# Patient Record
Sex: Female | Born: 1985 | Race: Black or African American | Hispanic: No | Marital: Single | Smoking: Never smoker
Health system: Southern US, Community
[De-identification: ages and names within clinical notes are randomized; demographics above are authoritative.]

## PROBLEM LIST (undated history)

## (undated) DIAGNOSIS — E039 Hypothyroidism, unspecified: Secondary | ICD-10-CM

## (undated) DIAGNOSIS — I749 Embolism and thrombosis of unspecified artery: Secondary | ICD-10-CM

## (undated) DIAGNOSIS — M329 Systemic lupus erythematosus, unspecified: Secondary | ICD-10-CM

## (undated) DIAGNOSIS — R809 Proteinuria, unspecified: Secondary | ICD-10-CM

## (undated) HISTORY — PX: KNEE SURGERY: SHX244

## (undated) HISTORY — PX: THYROID LOBECTOMY: SHX420

---
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
## 2005-03-03 ENCOUNTER — Other Ambulatory Visit: Admission: RE | Admit: 2005-03-03 | Discharge: 2005-03-03 | Payer: Self-pay | Admitting: Family Medicine

## 2006-09-28 ENCOUNTER — Emergency Department (HOSPITAL_COMMUNITY): Admission: EM | Admit: 2006-09-28 | Discharge: 2006-09-28 | Payer: Self-pay | Admitting: Emergency Medicine

## 2007-07-08 ENCOUNTER — Other Ambulatory Visit: Admission: RE | Admit: 2007-07-08 | Discharge: 2007-07-08 | Payer: Self-pay | Admitting: Family Medicine

## 2008-07-11 ENCOUNTER — Other Ambulatory Visit: Admission: RE | Admit: 2008-07-11 | Discharge: 2008-07-11 | Payer: Self-pay | Admitting: Family Medicine

## 2008-09-08 ENCOUNTER — Emergency Department (HOSPITAL_COMMUNITY): Admission: EM | Admit: 2008-09-08 | Discharge: 2008-09-08 | Payer: Self-pay | Admitting: Emergency Medicine

## 2008-09-30 ENCOUNTER — Emergency Department (HOSPITAL_COMMUNITY): Admission: EM | Admit: 2008-09-30 | Discharge: 2008-09-30 | Payer: Self-pay | Admitting: Emergency Medicine

## 2008-10-03 ENCOUNTER — Ambulatory Visit (HOSPITAL_COMMUNITY): Admission: RE | Admit: 2008-10-03 | Discharge: 2008-10-03 | Payer: Self-pay | Admitting: Family Medicine

## 2008-10-03 DIAGNOSIS — R809 Proteinuria, unspecified: Secondary | ICD-10-CM

## 2008-10-03 HISTORY — DX: Proteinuria, unspecified: R80.9

## 2008-10-07 ENCOUNTER — Ambulatory Visit: Payer: Self-pay | Admitting: Vascular Surgery

## 2008-10-07 ENCOUNTER — Encounter (INDEPENDENT_AMBULATORY_CARE_PROVIDER_SITE_OTHER): Payer: Self-pay | Admitting: Emergency Medicine

## 2008-10-07 ENCOUNTER — Inpatient Hospital Stay (HOSPITAL_COMMUNITY): Admission: EM | Admit: 2008-10-07 | Discharge: 2008-10-13 | Payer: Self-pay | Admitting: Emergency Medicine

## 2008-10-08 ENCOUNTER — Encounter (INDEPENDENT_AMBULATORY_CARE_PROVIDER_SITE_OTHER): Payer: Self-pay | Admitting: Internal Medicine

## 2008-11-13 ENCOUNTER — Encounter: Admission: RE | Admit: 2008-11-13 | Discharge: 2008-11-13 | Payer: Self-pay | Admitting: Internal Medicine

## 2008-12-09 ENCOUNTER — Emergency Department (HOSPITAL_COMMUNITY): Admission: EM | Admit: 2008-12-09 | Discharge: 2008-12-09 | Payer: Self-pay | Admitting: Emergency Medicine

## 2008-12-26 ENCOUNTER — Ambulatory Visit (HOSPITAL_COMMUNITY): Admission: RE | Admit: 2008-12-26 | Discharge: 2008-12-26 | Payer: Self-pay | Admitting: Obstetrics and Gynecology

## 2009-04-28 ENCOUNTER — Emergency Department (HOSPITAL_COMMUNITY): Admission: EM | Admit: 2009-04-28 | Discharge: 2009-04-28 | Payer: Self-pay | Admitting: Emergency Medicine

## 2009-05-03 ENCOUNTER — Emergency Department (HOSPITAL_COMMUNITY): Admission: EM | Admit: 2009-05-03 | Discharge: 2009-05-03 | Payer: Self-pay | Admitting: Emergency Medicine

## 2009-06-28 ENCOUNTER — Encounter (INDEPENDENT_AMBULATORY_CARE_PROVIDER_SITE_OTHER): Payer: Self-pay | Admitting: Nephrology

## 2009-06-28 ENCOUNTER — Ambulatory Visit (HOSPITAL_COMMUNITY): Admission: RE | Admit: 2009-06-28 | Discharge: 2009-06-29 | Payer: Self-pay | Admitting: Nephrology

## 2009-07-03 DIAGNOSIS — M329 Systemic lupus erythematosus, unspecified: Secondary | ICD-10-CM

## 2009-07-03 HISTORY — DX: Systemic lupus erythematosus, unspecified: M32.9

## 2009-07-11 ENCOUNTER — Other Ambulatory Visit: Admission: RE | Admit: 2009-07-11 | Discharge: 2009-07-11 | Payer: Self-pay | Admitting: Interventional Radiology

## 2009-07-11 ENCOUNTER — Encounter: Admission: RE | Admit: 2009-07-11 | Discharge: 2009-07-11 | Payer: Self-pay | Admitting: Family Medicine

## 2009-08-14 ENCOUNTER — Ambulatory Visit (HOSPITAL_COMMUNITY): Admission: RE | Admit: 2009-08-14 | Discharge: 2009-08-15 | Payer: Self-pay | Admitting: General Surgery

## 2009-08-14 ENCOUNTER — Encounter (INDEPENDENT_AMBULATORY_CARE_PROVIDER_SITE_OTHER): Payer: Self-pay | Admitting: General Surgery

## 2009-11-14 ENCOUNTER — Encounter (HOSPITAL_COMMUNITY)
Admission: RE | Admit: 2009-11-14 | Discharge: 2010-02-04 | Payer: Self-pay | Source: Home / Self Care | Attending: Nephrology | Admitting: Nephrology

## 2010-01-11 ENCOUNTER — Emergency Department (HOSPITAL_COMMUNITY)
Admission: EM | Admit: 2010-01-11 | Discharge: 2010-01-11 | Payer: Self-pay | Source: Home / Self Care | Admitting: Emergency Medicine

## 2010-01-30 ENCOUNTER — Emergency Department (HOSPITAL_COMMUNITY)
Admission: EM | Admit: 2010-01-30 | Discharge: 2010-01-31 | Payer: Self-pay | Source: Home / Self Care | Admitting: Emergency Medicine

## 2010-01-31 ENCOUNTER — Encounter (INDEPENDENT_AMBULATORY_CARE_PROVIDER_SITE_OTHER): Payer: Self-pay | Admitting: Emergency Medicine

## 2010-01-31 ENCOUNTER — Ambulatory Visit (HOSPITAL_COMMUNITY)
Admission: RE | Admit: 2010-01-31 | Discharge: 2010-01-31 | Payer: Self-pay | Source: Home / Self Care | Attending: Emergency Medicine | Admitting: Emergency Medicine

## 2010-04-14 LAB — COMPREHENSIVE METABOLIC PANEL
AST: 20 U/L (ref 0–37)
Albumin: 3.5 g/dL (ref 3.5–5.2)
BUN: 11 mg/dL (ref 6–23)
Creatinine, Ser: 0.72 mg/dL (ref 0.4–1.2)
Glucose, Bld: 92 mg/dL (ref 70–99)
Total Protein: 7.4 g/dL (ref 6.0–8.3)

## 2010-04-14 LAB — CBC
MCH: 28.5 pg (ref 26.0–34.0)
MCHC: 33.8 g/dL (ref 30.0–36.0)
MCV: 84.2 fL (ref 78.0–100.0)
RBC: 4.11 MIL/uL (ref 3.87–5.11)
WBC: 3.6 10*3/uL — ABNORMAL LOW (ref 4.0–10.5)

## 2010-04-14 LAB — DIFFERENTIAL
Basophils Relative: 0 % (ref 0–1)
Eosinophils Absolute: 0 10*3/uL (ref 0.0–0.7)
Eosinophils Relative: 1 % (ref 0–5)
Lymphocytes Relative: 34 % (ref 12–46)
Lymphs Abs: 1.2 10*3/uL (ref 0.7–4.0)
Monocytes Relative: 8 % (ref 3–12)

## 2010-04-14 LAB — PROTIME-INR: INR: 0.99 (ref 0.00–1.49)

## 2010-04-20 LAB — SURGICAL PCR SCREEN
MRSA, PCR: NEGATIVE
Staphylococcus aureus: NEGATIVE

## 2010-04-20 LAB — DIFFERENTIAL
Basophils Absolute: 0 10*3/uL (ref 0.0–0.1)
Eosinophils Relative: 0 % (ref 0–5)
Monocytes Absolute: 0.4 10*3/uL (ref 0.1–1.0)
Monocytes Relative: 7 % (ref 3–12)
Smear Review: DECREASED

## 2010-04-20 LAB — COMPREHENSIVE METABOLIC PANEL
AST: 16 U/L (ref 0–37)
Albumin: 3.3 g/dL — ABNORMAL LOW (ref 3.5–5.2)
BUN: 10 mg/dL (ref 6–23)
Creatinine, Ser: 0.8 mg/dL (ref 0.4–1.2)
Glucose, Bld: 80 mg/dL (ref 70–99)
Potassium: 3.8 mEq/L (ref 3.5–5.1)

## 2010-04-20 LAB — CBC
HCT: 34.8 % — ABNORMAL LOW (ref 36.0–46.0)
Hemoglobin: 11.4 g/dL — ABNORMAL LOW (ref 12.0–15.0)
MCH: 27 pg (ref 26.0–34.0)
MCV: 82.4 fL (ref 78.0–100.0)
RBC: 4.23 MIL/uL (ref 3.87–5.11)
RDW: 14.9 % (ref 11.5–15.5)

## 2010-04-21 LAB — CBC
HCT: 32 % — ABNORMAL LOW (ref 36.0–46.0)
HCT: 32.1 % — ABNORMAL LOW (ref 36.0–46.0)
Hemoglobin: 10.4 g/dL — ABNORMAL LOW (ref 12.0–15.0)
MCHC: 33.7 g/dL (ref 30.0–36.0)
MCHC: 33.9 g/dL (ref 30.0–36.0)
MCV: 83.1 fL (ref 78.0–100.0)
MCV: 83.4 fL (ref 78.0–100.0)
MCV: 83.7 fL (ref 78.0–100.0)
MCV: 84 fL (ref 78.0–100.0)
Platelets: 236 10*3/uL (ref 150–400)
RBC: 3.67 MIL/uL — ABNORMAL LOW (ref 3.87–5.11)
RBC: 3.82 MIL/uL — ABNORMAL LOW (ref 3.87–5.11)
RBC: 3.98 MIL/uL (ref 3.87–5.11)
RDW: 14.1 % (ref 11.5–15.5)
RDW: 14.4 % (ref 11.5–15.5)
WBC: 6 10*3/uL (ref 4.0–10.5)
WBC: 7 10*3/uL (ref 4.0–10.5)

## 2010-04-21 LAB — TYPE AND SCREEN: Antibody Screen: NEGATIVE

## 2010-04-21 LAB — PLATELET FUNCTION ASSAY: Collagen / Epinephrine: 108 seconds (ref 0–184)

## 2010-04-21 LAB — PROTIME-INR
INR: 0.97 (ref 0.00–1.49)
Prothrombin Time: 12.8 seconds (ref 11.6–15.2)

## 2010-04-23 LAB — CBC
Platelets: 302 10*3/uL (ref 150–400)
RDW: 15.6 % — ABNORMAL HIGH (ref 11.5–15.5)
WBC: 8.6 10*3/uL (ref 4.0–10.5)

## 2010-04-23 LAB — COMPREHENSIVE METABOLIC PANEL
ALT: 18 U/L (ref 0–35)
AST: 20 U/L (ref 0–37)
Albumin: 2.9 g/dL — ABNORMAL LOW (ref 3.5–5.2)
Alkaline Phosphatase: 64 U/L (ref 39–117)
GFR calc Af Amer: >60 mL/min (ref >60)
Potassium: 4.2 mEq/L (ref 3.5–5.1)
Sodium: 138 mEq/L (ref 135–145)
Total Protein: 6.3 g/dL (ref 6.0–8.3)

## 2010-04-23 LAB — DIFFERENTIAL
Basophils Relative: 0 % (ref 0–1)
Eosinophils Absolute: 0 10*3/uL (ref 0.0–0.7)
Eosinophils Relative: 0 % (ref 0–5)
Monocytes Absolute: 0.1 10*3/uL (ref 0.1–1.0)
Monocytes Relative: 1 % — ABNORMAL LOW (ref 3–12)
Neutro Abs: 7.8 10*3/uL — ABNORMAL HIGH (ref 1.7–7.7)

## 2010-04-26 ENCOUNTER — Emergency Department (HOSPITAL_COMMUNITY)
Admission: EM | Admit: 2010-04-26 | Discharge: 2010-04-26 | Disposition: A | Discharge status: Home / Self Care | Payer: 59 | Attending: Emergency Medicine | Admitting: Emergency Medicine

## 2010-04-26 DIAGNOSIS — M545 Low back pain, unspecified: Secondary | ICD-10-CM | POA: Insufficient documentation

## 2010-04-26 DIAGNOSIS — E039 Hypothyroidism, unspecified: Secondary | ICD-10-CM | POA: Insufficient documentation

## 2010-04-26 DIAGNOSIS — I10 Essential (primary) hypertension: Secondary | ICD-10-CM | POA: Insufficient documentation

## 2010-04-26 DIAGNOSIS — M329 Systemic lupus erythematosus, unspecified: Secondary | ICD-10-CM | POA: Insufficient documentation

## 2010-04-26 DIAGNOSIS — M549 Dorsalgia, unspecified: Secondary | ICD-10-CM | POA: Insufficient documentation

## 2010-04-26 LAB — URINALYSIS, ROUTINE W REFLEX MICROSCOPIC
Glucose, UA: NEGATIVE mg/dL
Hgb urine dipstick: NEGATIVE
Ketones, ur: NEGATIVE mg/dL
Leukocytes, UA: NEGATIVE
pH: 7 (ref 5.0–8.0)

## 2010-04-26 LAB — URINE MICROSCOPIC-ADD ON

## 2010-04-27 LAB — URINALYSIS, ROUTINE W REFLEX MICROSCOPIC
Ketones, ur: NEGATIVE mg/dL
Leukocytes, UA: NEGATIVE
Nitrite: NEGATIVE
Protein, ur: 100 mg/dL — AB
pH: 7.5 (ref 5.0–8.0)

## 2010-04-27 LAB — URINE CULTURE: Culture: NO GROWTH

## 2010-04-27 LAB — DIFFERENTIAL
Basophils Absolute: 0.1 10*3/uL (ref 0.0–0.1)
Eosinophils Absolute: 0 10*3/uL (ref 0.0–0.7)
Lymphocytes Relative: 10 % — ABNORMAL LOW (ref 12–46)
Neutrophils Relative %: 87 % — ABNORMAL HIGH (ref 43–77)

## 2010-04-27 LAB — POCT I-STAT, CHEM 8
Glucose, Bld: 96 mg/dL (ref 70–99)
HCT: 38 % (ref 36.0–46.0)
Hemoglobin: 12.9 g/dL (ref 12.0–15.0)
Potassium: 3.9 mEq/L (ref 3.5–5.1)
Sodium: 137 mEq/L (ref 135–145)

## 2010-04-27 LAB — CBC
MCHC: 33.8 g/dL (ref 30.0–36.0)
RDW: 15.1 % (ref 11.5–15.5)

## 2010-04-27 LAB — APTT: aPTT: 37 seconds (ref 24–37)

## 2010-04-27 LAB — PROTIME-INR: INR: 1.86 — ABNORMAL HIGH (ref 0.00–1.49)

## 2010-05-07 LAB — URINALYSIS, ROUTINE W REFLEX MICROSCOPIC
Bilirubin Urine: NEGATIVE
Glucose, UA: NEGATIVE mg/dL
Ketones, ur: NEGATIVE mg/dL
Nitrite: NEGATIVE
Specific Gravity, Urine: 1.009 (ref 1.005–1.030)
pH: 6.5 (ref 5.0–8.0)

## 2010-05-07 LAB — PREGNANCY, URINE: Preg Test, Ur: POSITIVE

## 2010-05-07 LAB — CBC
Hemoglobin: 11.3 g/dL — ABNORMAL LOW (ref 12.0–15.0)
RBC: 3.49 MIL/uL — ABNORMAL LOW (ref 3.87–5.11)
RDW: 16.2 % — ABNORMAL HIGH (ref 11.5–15.5)

## 2010-05-07 LAB — BASIC METABOLIC PANEL
BUN: 9 mg/dL (ref 6–23)
Calcium: 8.3 mg/dL — ABNORMAL LOW (ref 8.4–10.5)
Creatinine, Ser: 0.75 mg/dL (ref 0.4–1.2)
GFR calc non Af Amer: >60 mL/min (ref >60)
Glucose, Bld: 75 mg/dL (ref 70–99)

## 2010-05-07 LAB — TYPE AND SCREEN
ABO/RH(D): O POS
Antibody Screen: NEGATIVE

## 2010-05-07 LAB — PROTIME-INR
INR: 0.86 (ref 0.00–1.49)
Prothrombin Time: 11.6 seconds (ref 11.6–15.2)
Prothrombin Time: 16.5 seconds — ABNORMAL HIGH (ref 11.6–15.2)

## 2010-05-07 LAB — APTT: aPTT: 29 seconds (ref 24–37)

## 2010-05-07 LAB — URINE MICROSCOPIC-ADD ON

## 2010-05-07 LAB — ABO/RH: ABO/RH(D): O POS

## 2010-05-09 LAB — COMPREHENSIVE METABOLIC PANEL
Albumin: <1 g/dL — ABNORMAL LOW (ref 3.5–5.2)
BUN: 2 mg/dL — ABNORMAL LOW (ref 6–23)
Creatinine, Ser: 1.09 mg/dL (ref 0.4–1.2)
Total Protein: 5.3 g/dL — ABNORMAL LOW (ref 6.0–8.3)

## 2010-05-09 LAB — CBC
HCT: 28.8 % — ABNORMAL LOW (ref 36.0–46.0)
HCT: 29.1 % — ABNORMAL LOW (ref 36.0–46.0)
Hemoglobin: 10.7 g/dL — ABNORMAL LOW (ref 12.0–15.0)
Hemoglobin: 10.9 g/dL — ABNORMAL LOW (ref 12.0–15.0)
Hemoglobin: 11 g/dL — ABNORMAL LOW (ref 12.0–15.0)
Hemoglobin: 11.7 g/dL — ABNORMAL LOW (ref 12.0–15.0)
Hemoglobin: 9.8 g/dL — ABNORMAL LOW (ref 12.0–15.0)
Hemoglobin: 9.9 g/dL — ABNORMAL LOW (ref 12.0–15.0)
MCHC: 33.7 g/dL (ref 30.0–36.0)
MCHC: 33.8 g/dL (ref 30.0–36.0)
MCHC: 34.2 g/dL (ref 30.0–36.0)
MCHC: 34.2 g/dL (ref 30.0–36.0)
MCHC: 34.5 g/dL (ref 30.0–36.0)
MCV: 88.2 fL (ref 78.0–100.0)
Platelets: 230 10*3/uL (ref 150–400)
Platelets: 267 10*3/uL (ref 150–400)
RBC: 3.3 MIL/uL — ABNORMAL LOW (ref 3.87–5.11)
RBC: 3.57 MIL/uL — ABNORMAL LOW (ref 3.87–5.11)
RBC: 3.63 MIL/uL — ABNORMAL LOW (ref 3.87–5.11)
RDW: 12 % (ref 11.5–15.5)
RDW: 12.2 % (ref 11.5–15.5)
RDW: 12.2 % (ref 11.5–15.5)
RDW: 12.6 % (ref 11.5–15.5)
WBC: 3 10*3/uL — ABNORMAL LOW (ref 4.0–10.5)
WBC: 3.4 10*3/uL — ABNORMAL LOW (ref 4.0–10.5)
WBC: 4.2 10*3/uL (ref 4.0–10.5)
WBC: 5.9 10*3/uL (ref 4.0–10.5)

## 2010-05-09 LAB — BASIC METABOLIC PANEL
BUN: 2 mg/dL — ABNORMAL LOW (ref 6–23)
CO2: 26 mEq/L (ref 19–32)
CO2: 26 mEq/L (ref 19–32)
CO2: 26 mEq/L (ref 19–32)
CO2: 26 mEq/L (ref 19–32)
Calcium: 7.2 mg/dL — ABNORMAL LOW (ref 8.4–10.5)
Calcium: 7.4 mg/dL — ABNORMAL LOW (ref 8.4–10.5)
Calcium: 7.5 mg/dL — ABNORMAL LOW (ref 8.4–10.5)
Calcium: 7.6 mg/dL — ABNORMAL LOW (ref 8.4–10.5)
Chloride: 108 mEq/L (ref 96–112)
Chloride: 108 mEq/L (ref 96–112)
Chloride: 111 mEq/L (ref 96–112)
Creatinine, Ser: 0.94 mg/dL (ref 0.4–1.2)
Creatinine, Ser: 0.98 mg/dL (ref 0.4–1.2)
Creatinine, Ser: 1.05 mg/dL (ref 0.4–1.2)
Creatinine, Ser: 1.38 mg/dL — ABNORMAL HIGH (ref 0.4–1.2)
GFR calc Af Amer: >60 mL/min (ref >60)
GFR calc Af Amer: >60 mL/min (ref >60)
GFR calc Af Amer: >60 mL/min (ref >60)
GFR calc non Af Amer: 49 mL/min — ABNORMAL LOW (ref >60)
GFR calc non Af Amer: 60 mL/min — ABNORMAL LOW (ref >60)
GFR calc non Af Amer: >60 mL/min (ref >60)
Glucose, Bld: 82 mg/dL (ref 70–99)
Glucose, Bld: 85 mg/dL (ref 70–99)
Glucose, Bld: 86 mg/dL (ref 70–99)
Potassium: 3.8 mEq/L (ref 3.5–5.1)
Sodium: 136 mEq/L (ref 135–145)
Sodium: 138 mEq/L (ref 135–145)
Sodium: 138 mEq/L (ref 135–145)
Sodium: 138 mEq/L (ref 135–145)
Sodium: 139 mEq/L (ref 135–145)

## 2010-05-09 LAB — URINALYSIS, ROUTINE W REFLEX MICROSCOPIC
Ketones, ur: NEGATIVE mg/dL
Leukocytes, UA: NEGATIVE
Nitrite: NEGATIVE
Protein, ur: >300 mg/dL — AB
Urobilinogen, UA: 1 mg/dL (ref 0.0–1.0)

## 2010-05-09 LAB — CULTURE, BLOOD (ROUTINE X 2)
Culture: NO GROWTH
Culture: NO GROWTH

## 2010-05-09 LAB — C3 COMPLEMENT: C3 Complement: 125 mg/dL (ref 88–201)

## 2010-05-09 LAB — PHOSPHORUS
Phosphorus: 4.1 mg/dL (ref 2.3–4.6)
Phosphorus: 4.5 mg/dL (ref 2.3–4.6)
Phosphorus: 4.6 mg/dL (ref 2.3–4.6)

## 2010-05-09 LAB — URINE CULTURE
Colony Count: NO GROWTH
Culture: NO GROWTH

## 2010-05-09 LAB — EXTRACTABLE NUCLEAR ANTIGEN ANTIBODY
SSB (La) (ENA) Antibody, IgG: 0.4 AI (ref <1.0)
Scleroderma (Scl-70) (ENA) Antibody, IgG: 0.4 AI (ref <1.0)

## 2010-05-09 LAB — HEPATITIS B SURFACE ANTIGEN: Hepatitis B Surface Ag: NEGATIVE

## 2010-05-09 LAB — POCT I-STAT, CHEM 8
Calcium, Ion: 1.07 mmol/L — ABNORMAL LOW (ref 1.12–1.32)
HCT: 26 % — ABNORMAL LOW (ref 36.0–46.0)
Sodium: 138 mEq/L (ref 135–145)
TCO2: 25 mmol/L (ref 0–100)

## 2010-05-09 LAB — APTT
aPTT: 112 seconds — ABNORMAL HIGH (ref 24–37)
aPTT: 119 seconds — ABNORMAL HIGH (ref 24–37)
aPTT: 42 seconds — ABNORMAL HIGH (ref 24–37)
aPTT: 68 seconds — ABNORMAL HIGH (ref 24–37)
aPTT: 90 seconds — ABNORMAL HIGH (ref 24–37)

## 2010-05-09 LAB — HEPATITIS C ANTIBODY: HCV Ab: NEGATIVE

## 2010-05-09 LAB — ANTI-NUCLEAR AB-TITER (ANA TITER): ANA Titer 1: 1:160 {titer} — ABNORMAL HIGH

## 2010-05-09 LAB — ANTI-DNA ANTIBODY, DOUBLE-STRANDED: ds DNA Ab: <1 IU/mL (ref <5)

## 2010-05-09 LAB — TSH: TSH: 5.64 u[IU]/mL — ABNORMAL HIGH (ref 0.350–4.500)

## 2010-05-09 LAB — PROTIME-INR
INR: 1.1 (ref 0.00–1.49)
INR: 2.1 — ABNORMAL HIGH (ref 0.00–1.49)
INR: 2.9 — ABNORMAL HIGH (ref 0.00–1.49)
INR: 4.4 — ABNORMAL HIGH (ref 0.00–1.49)
Prothrombin Time: 23.8 seconds — ABNORMAL HIGH (ref 11.6–15.2)
Prothrombin Time: 33.5 seconds — ABNORMAL HIGH (ref 11.6–15.2)
Prothrombin Time: 41.6 seconds — ABNORMAL HIGH (ref 11.6–15.2)

## 2010-05-09 LAB — MAGNESIUM
Magnesium: 1.7 mg/dL (ref 1.5–2.5)
Magnesium: 1.7 mg/dL (ref 1.5–2.5)
Magnesium: 1.8 mg/dL (ref 1.5–2.5)

## 2010-05-09 LAB — ANA: Anti Nuclear Antibody(ANA): POSITIVE — AB

## 2010-05-09 LAB — URINALYSIS, MICROSCOPIC ONLY
Glucose, UA: NEGATIVE mg/dL
Leukocytes, UA: NEGATIVE
Specific Gravity, Urine: 1.009 (ref 1.005–1.030)
Urobilinogen, UA: 0.2 mg/dL (ref 0.0–1.0)

## 2010-05-09 LAB — BRAIN NATRIURETIC PEPTIDE: Pro B Natriuretic peptide (BNP): 73.9 pg/mL (ref 0.0–100.0)

## 2010-05-09 LAB — SEDIMENTATION RATE: Sed Rate: 133 mm/hr — ABNORMAL HIGH (ref 0–22)

## 2010-05-09 LAB — PROTEIN, URINE, 24 HOUR: Urine Total Volume-UPROT: 3400 mL

## 2010-05-09 LAB — MYOGLOBIN, URINE: Myoglobin, Ur: 152 mcg/L — ABNORMAL HIGH (ref <28)

## 2010-05-09 LAB — HEPARIN LEVEL (UNFRACTIONATED): Heparin Unfractionated: 0.17 IU/mL — ABNORMAL LOW (ref 0.30–0.70)

## 2010-05-09 LAB — DIFFERENTIAL
Basophils Absolute: 0 10*3/uL (ref 0.0–0.1)
Lymphocytes Relative: 12 % (ref 12–46)
Lymphs Abs: 0.7 10*3/uL (ref 0.7–4.0)
Monocytes Absolute: 0.4 10*3/uL (ref 0.1–1.0)
Neutro Abs: 4.7 10*3/uL (ref 1.7–7.7)

## 2010-05-09 LAB — C4 COMPLEMENT: Complement C4, Body Fluid: 41 mg/dL (ref 16–47)

## 2010-05-09 LAB — GLOMERULAR BASEMENT MEMBRANE ANTIBODIES

## 2010-05-09 LAB — D-DIMER, QUANTITATIVE: D-Dimer, Quant: 5.21 ug/mL-FEU — ABNORMAL HIGH (ref 0.00–0.48)

## 2010-05-10 LAB — URINALYSIS, ROUTINE W REFLEX MICROSCOPIC
Bilirubin Urine: NEGATIVE
Ketones, ur: NEGATIVE mg/dL
Nitrite: NEGATIVE
Protein, ur: >300 mg/dL — AB
Urobilinogen, UA: 0.2 mg/dL (ref 0.0–1.0)

## 2010-05-10 LAB — CBC
HCT: 35.8 % — ABNORMAL LOW (ref 36.0–46.0)
Hemoglobin: 11.8 g/dL — ABNORMAL LOW (ref 12.0–15.0)
MCV: 90.8 fL (ref 78.0–100.0)
Platelets: 203 10*3/uL (ref 150–400)
RBC: 3.94 MIL/uL (ref 3.87–5.11)
WBC: 4.8 10*3/uL (ref 4.0–10.5)

## 2010-05-10 LAB — DIFFERENTIAL
Basophils Absolute: 0.1 10*3/uL (ref 0.0–0.1)
Basophils Relative: 1 % (ref 0–1)
Monocytes Absolute: 0.6 10*3/uL (ref 0.1–1.0)
Neutro Abs: 2.7 10*3/uL (ref 1.7–7.7)

## 2010-05-10 LAB — COMPREHENSIVE METABOLIC PANEL
Albumin: 1.2 g/dL — ABNORMAL LOW (ref 3.5–5.2)
Alkaline Phosphatase: 71 U/L (ref 39–117)
BUN: 4 mg/dL — ABNORMAL LOW (ref 6–23)
CO2: 24 mEq/L (ref 19–32)
Chloride: 109 mEq/L (ref 96–112)
GFR calc non Af Amer: >60 mL/min (ref >60)
Glucose, Bld: 84 mg/dL (ref 70–99)
Potassium: 3.2 mEq/L — ABNORMAL LOW (ref 3.5–5.1)
Total Bilirubin: 0.2 mg/dL — ABNORMAL LOW (ref 0.3–1.2)

## 2010-05-10 LAB — POCT PREGNANCY, URINE: Preg Test, Ur: NEGATIVE

## 2010-05-10 LAB — LIPASE, BLOOD: Lipase: 21 U/L (ref 11–59)

## 2010-05-20 ENCOUNTER — Other Ambulatory Visit (HOSPITAL_COMMUNITY): Payer: Self-pay | Admitting: Orthopedic Surgery

## 2010-05-20 DIAGNOSIS — M25561 Pain in right knee: Secondary | ICD-10-CM

## 2010-05-26 ENCOUNTER — Ambulatory Visit (HOSPITAL_COMMUNITY)
Admission: RE | Admit: 2010-05-26 | Discharge: 2010-05-26 | Disposition: A | Discharge status: Home / Self Care | Payer: 59 | Source: Ambulatory Visit | Attending: Orthopedic Surgery | Admitting: Orthopedic Surgery

## 2010-05-26 DIAGNOSIS — M949 Disorder of cartilage, unspecified: Secondary | ICD-10-CM | POA: Insufficient documentation

## 2010-05-26 DIAGNOSIS — M25569 Pain in unspecified knee: Secondary | ICD-10-CM | POA: Insufficient documentation

## 2010-05-26 DIAGNOSIS — M25561 Pain in right knee: Secondary | ICD-10-CM

## 2010-05-26 DIAGNOSIS — M899 Disorder of bone, unspecified: Secondary | ICD-10-CM | POA: Insufficient documentation

## 2010-07-05 ENCOUNTER — Emergency Department (HOSPITAL_COMMUNITY)
Admission: EM | Admit: 2010-07-05 | Discharge: 2010-07-05 | Disposition: A | Discharge status: Home / Self Care | Payer: Medicaid Other | Attending: Emergency Medicine | Admitting: Emergency Medicine

## 2010-07-05 DIAGNOSIS — M329 Systemic lupus erythematosus, unspecified: Secondary | ICD-10-CM | POA: Insufficient documentation

## 2010-07-05 DIAGNOSIS — E039 Hypothyroidism, unspecified: Secondary | ICD-10-CM | POA: Insufficient documentation

## 2010-07-05 DIAGNOSIS — M25569 Pain in unspecified knee: Secondary | ICD-10-CM | POA: Insufficient documentation

## 2010-07-05 LAB — PROTIME-INR: Prothrombin Time: 12.6 seconds (ref 11.6–15.2)

## 2010-09-15 IMAGING — US US SOFT TISSUE HEAD/NECK
1 series · 14 of 25 positions shown · non-contrast
Comparison: 10/09/2008

CLINICAL DATA: Follow up thyroid nodule.

THYROID ULTRASOUND
TECHNIQUE: Ultrasound examination of the thyroid gland and
adjacent soft tissues was performed.

[Series 1: us soft tissue head/neck · 0.07mm/px · 14 of 25 slices shown]
[im 1/25]
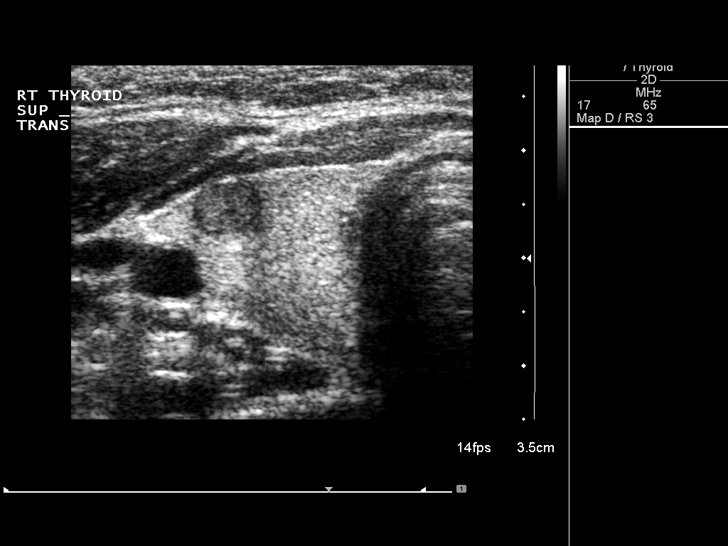
[im 3/25]
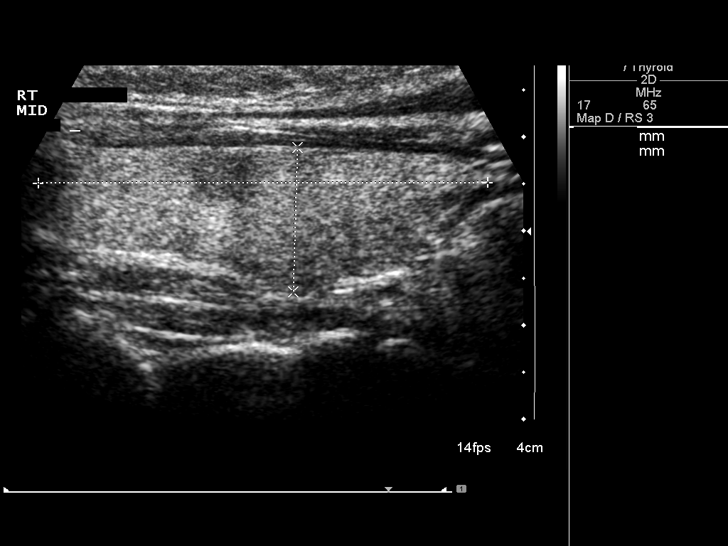
[im 5/25]
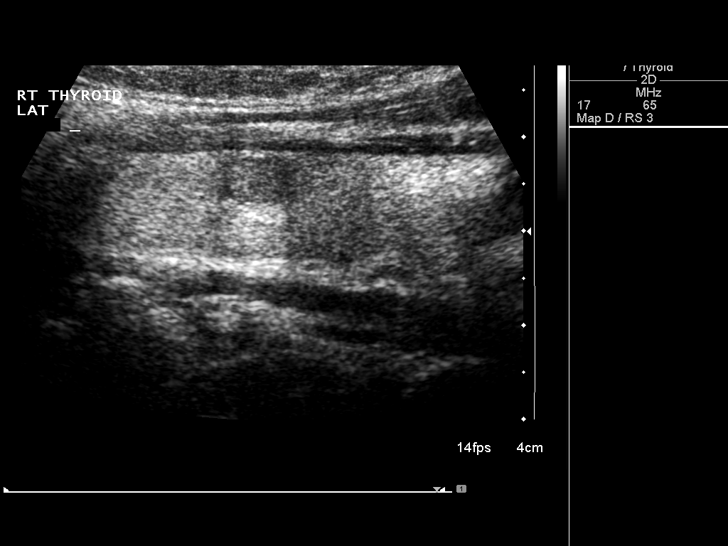
[im 7/25]
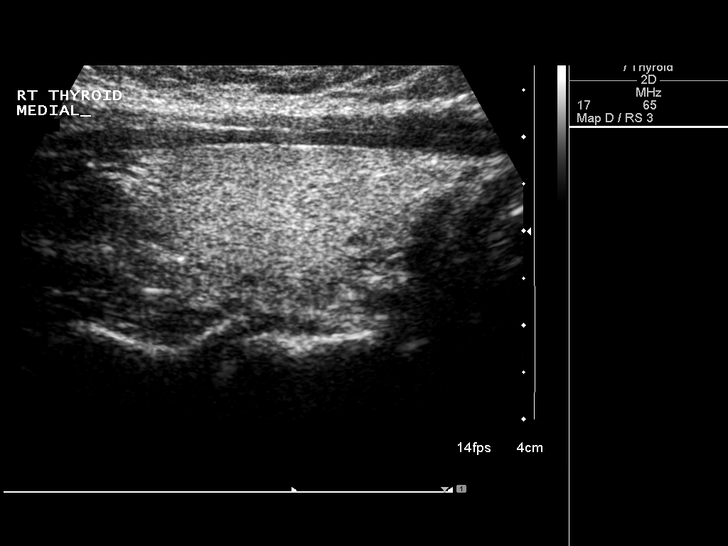
[im 9/25]
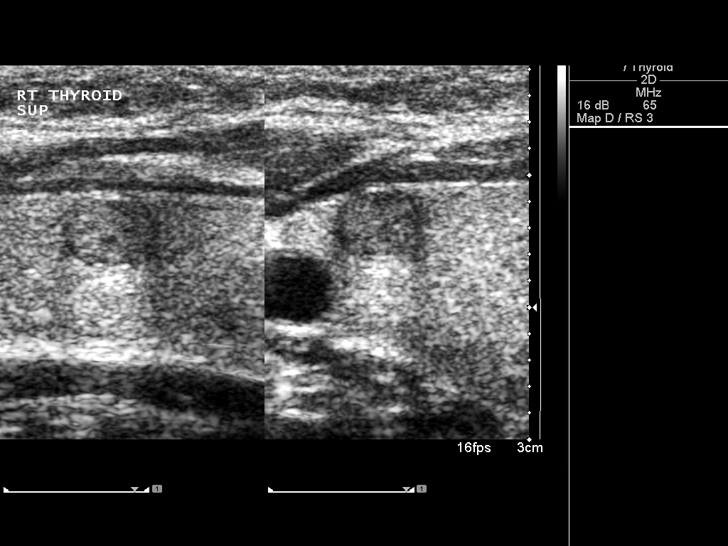
[im 10/25]
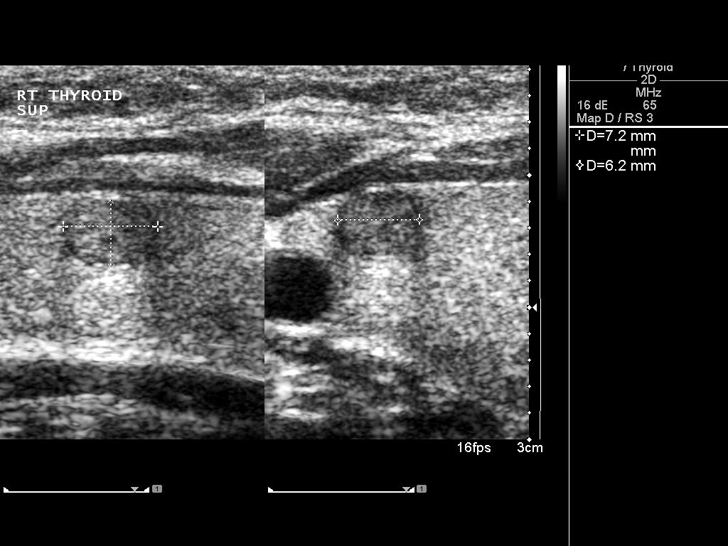
[im 12/25]
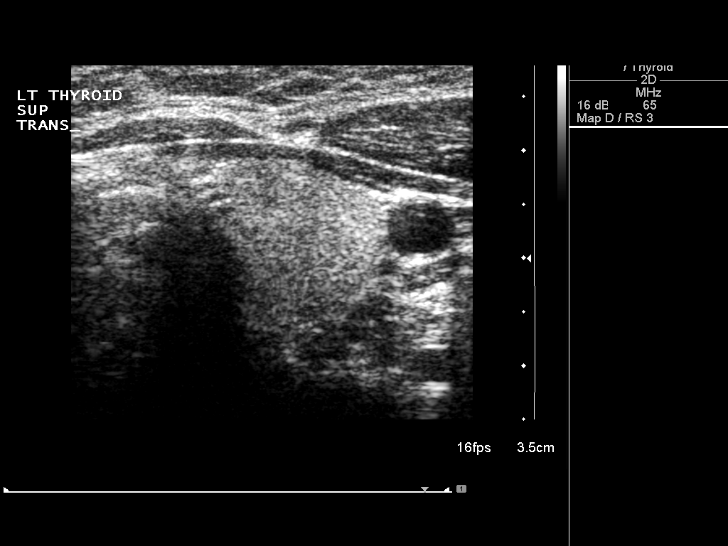
[im 14/25]
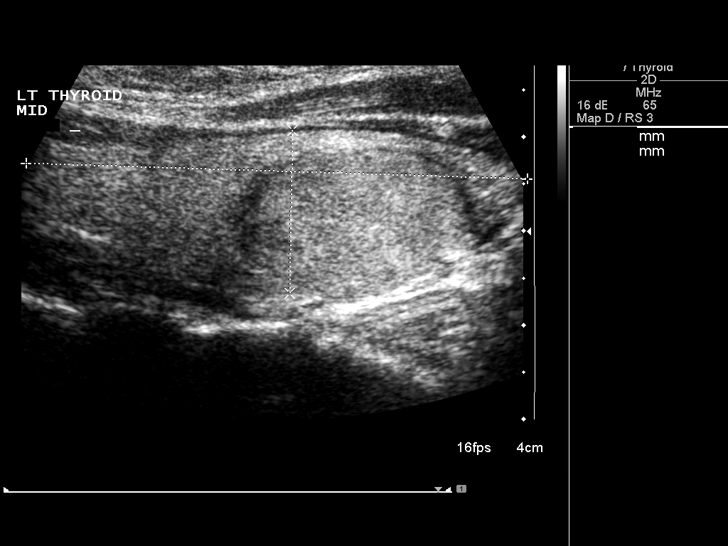
[im 16/25]
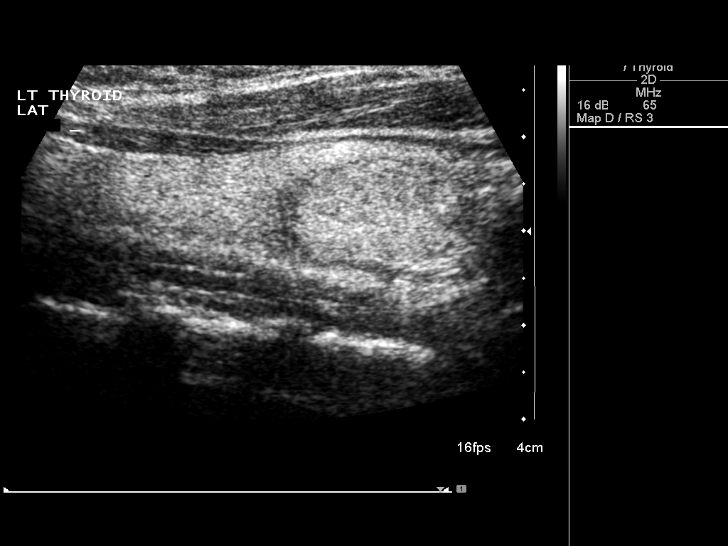
[im 17/25]
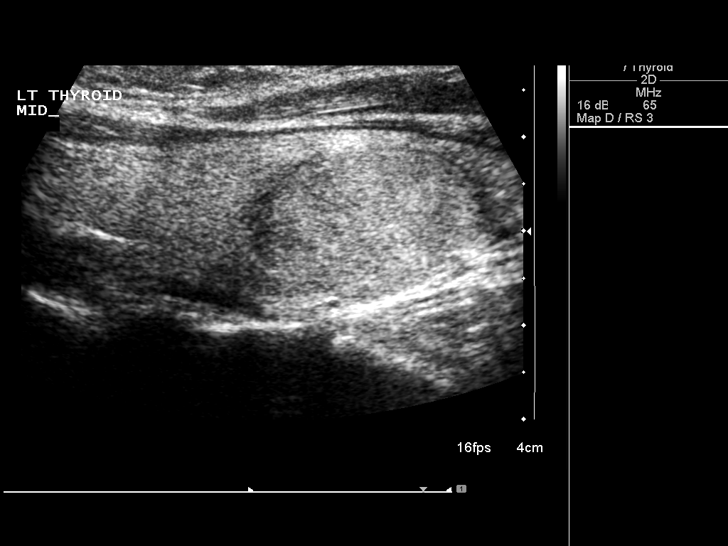
[im 19/25]
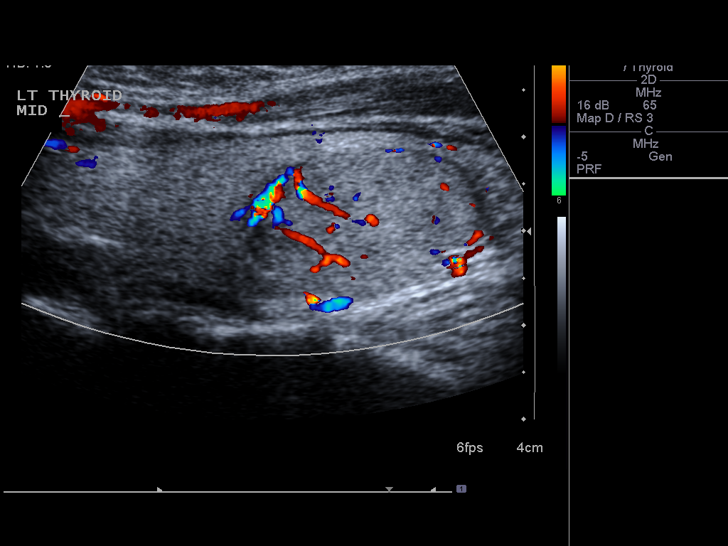
[im 21/25]
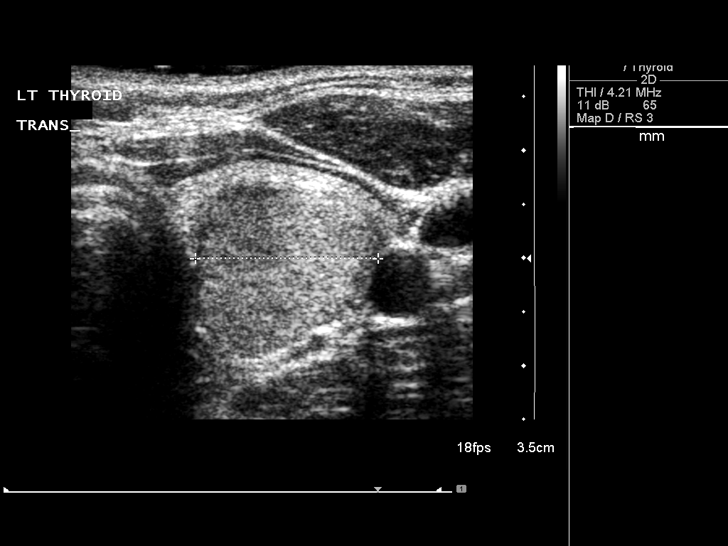
[im 23/25]
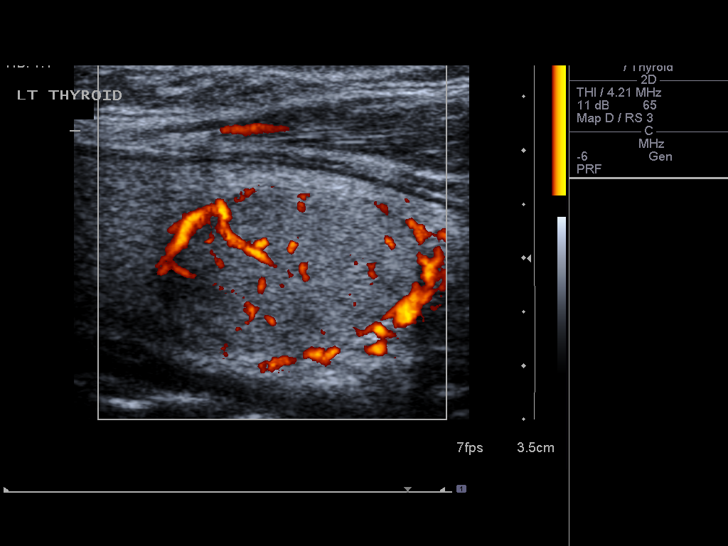
[im 25/25]
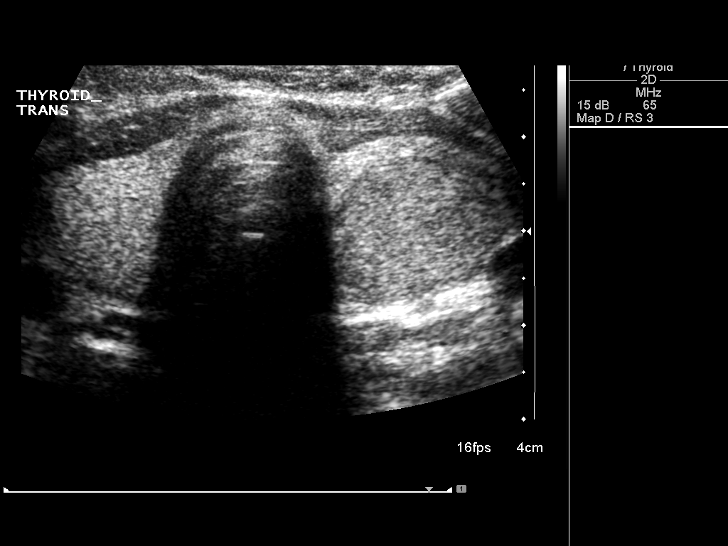

[14 of 25 positions shown; findings below may reference images not displayed]

FINDINGS: The right thyroid lobe measures 4.8 x 1.5 x 1.9 cm the
left lobe measures 5.3 x 1.7 x 2.0 cm.  The isthmus measures
cm.

Stable dominant solid nodule in the left thyroid lobe measures
x 1.6 x 1.7 cm.  Stable small nodules in the right thyroid lobe
which measure less than 1 cm.
IMPRESSION: Stable thyroid ultrasound examination.  Recommend fine-needle
aspiration biopsy of the solid dominant left lobe lesion.

## 2010-10-26 ENCOUNTER — Emergency Department (HOSPITAL_COMMUNITY)
Admission: EM | Admit: 2010-10-26 | Discharge: 2010-10-26 | Disposition: A | Discharge status: Home / Self Care | Payer: Medicaid Other | Attending: Emergency Medicine | Admitting: Emergency Medicine

## 2010-10-26 DIAGNOSIS — H5710 Ocular pain, unspecified eye: Secondary | ICD-10-CM | POA: Insufficient documentation

## 2010-10-26 DIAGNOSIS — Z86718 Personal history of other venous thrombosis and embolism: Secondary | ICD-10-CM | POA: Insufficient documentation

## 2010-10-26 DIAGNOSIS — M329 Systemic lupus erythematosus, unspecified: Secondary | ICD-10-CM | POA: Insufficient documentation

## 2010-10-26 DIAGNOSIS — E039 Hypothyroidism, unspecified: Secondary | ICD-10-CM | POA: Insufficient documentation

## 2010-10-26 DIAGNOSIS — H00019 Hordeolum externum unspecified eye, unspecified eyelid: Secondary | ICD-10-CM | POA: Insufficient documentation

## 2010-12-15 ENCOUNTER — Other Ambulatory Visit (HOSPITAL_COMMUNITY): Payer: Self-pay | Admitting: Orthopedic Surgery

## 2010-12-15 DIAGNOSIS — M879 Osteonecrosis, unspecified: Secondary | ICD-10-CM

## 2010-12-17 ENCOUNTER — Other Ambulatory Visit: Payer: Self-pay | Admitting: Radiology

## 2010-12-17 MED ORDER — SODIUM CHLORIDE 0.9 % IV SOLN: INTRAVENOUS | Status: DC

## 2010-12-18 ENCOUNTER — Encounter (HOSPITAL_COMMUNITY): Payer: Self-pay

## 2010-12-18 ENCOUNTER — Ambulatory Visit (HOSPITAL_COMMUNITY)
Admission: RE | Admit: 2010-12-18 | Discharge: 2010-12-18 | Disposition: A | Discharge status: Home / Self Care | Payer: Medicaid Other | Source: Ambulatory Visit | Attending: Orthopedic Surgery | Admitting: Orthopedic Surgery

## 2010-12-18 VITALS — BP 101/67 | HR 100 | Temp 100.0°F | Resp 16 | Ht 64.0 in | Wt 170.0 lb

## 2010-12-18 DIAGNOSIS — E039 Hypothyroidism, unspecified: Secondary | ICD-10-CM | POA: Insufficient documentation

## 2010-12-18 DIAGNOSIS — M879 Osteonecrosis, unspecified: Secondary | ICD-10-CM

## 2010-12-18 DIAGNOSIS — I10 Essential (primary) hypertension: Secondary | ICD-10-CM | POA: Insufficient documentation

## 2010-12-18 DIAGNOSIS — Z01812 Encounter for preprocedural laboratory examination: Secondary | ICD-10-CM | POA: Insufficient documentation

## 2010-12-18 DIAGNOSIS — M87059 Idiopathic aseptic necrosis of unspecified femur: Secondary | ICD-10-CM | POA: Insufficient documentation

## 2010-12-18 HISTORY — DX: Hypothyroidism, unspecified: E03.9

## 2010-12-18 HISTORY — DX: Proteinuria, unspecified: R80.9

## 2010-12-18 HISTORY — DX: Systemic lupus erythematosus, unspecified: M32.9

## 2010-12-18 LAB — CBC
HCT: 37.9 % (ref 36.0–46.0)
MCH: 29.6 pg (ref 26.0–34.0)
MCV: 85.6 fL (ref 78.0–100.0)
Platelets: 228 10*3/uL (ref 150–400)
RBC: 4.43 MIL/uL (ref 3.87–5.11)
WBC: 4.3 10*3/uL (ref 4.0–10.5)

## 2010-12-18 MED ORDER — SODIUM CHLORIDE 0.9 % IV SOLN
INTRAVENOUS | Status: AC | PRN
  Administered 2010-12-18: 50 mL/h via INTRAVENOUS

## 2010-12-18 MED ORDER — MIDAZOLAM HCL 5 MG/5ML IJ SOLN
INTRAMUSCULAR | Status: AC | PRN
  Administered 2010-12-18: 0.5 mg via INTRAVENOUS
  Administered 2010-12-18: 1 mg via INTRAVENOUS

## 2010-12-18 MED ORDER — FENTANYL CITRATE 0.05 MG/ML IJ SOLN
INTRAMUSCULAR | Status: AC | PRN
  Administered 2010-12-18: 50 ug via INTRAVENOUS

## 2010-12-18 MED ORDER — MIDAZOLAM HCL 2 MG/2ML IJ SOLN
INTRAMUSCULAR | Status: AC
  Administered 2010-12-18: 1 mg via INTRAVENOUS
  Filled 2010-12-18: qty 4

## 2010-12-18 MED ORDER — HYDROCODONE-ACETAMINOPHEN 10-325 MG PO TABS: 1.0000 | ORAL_TABLET | ORAL | Status: DC | PRN

## 2010-12-18 MED ORDER — FENTANYL CITRATE 0.05 MG/ML IJ SOLN
INTRAMUSCULAR | Status: AC
  Administered 2010-12-18: 25 ug via INTRAVENOUS
  Filled 2010-12-18: qty 4

## 2010-12-18 NOTE — ED Notes (Signed)
Comfortable . Dressing right leg clean dry and intact. Tolerating fluids and [place] sandwich. Mom at bedside.

## 2010-12-18 NOTE — ED Notes (Addendum)
Teaching re post bone biopsy and re post sedation to patient and mother. Verbalized understanding.  Aware to resume previous home meds. Teaching sheets given. Discharged w/c with RN to be driven home by mother. Bandaid right leg has small stain on it.

## 2010-12-18 NOTE — H&P (Signed)
Anita Keller is an 25 y.o. female.   Chief Complaint: Rt leg lesion; R/O AVN vs bone infarct HPI: scheduled for Rt femur biopsy  Past Medical History  Diagnosis Date  . Hypothyroidism   . Hypertension   . Proteinuria 10/2008    Past Surgical History  Procedure Date  . Thyroid lobectomy Left     Benign    History reviewed. No pertinent family history. Social History:  reports that she has never smoked. She does not have any smokeless tobacco history on file. Her alcohol and drug histories not on file.  Allergies:  Allergies  Allergen Reactions  . Aspirin Rash  . Codeine Rash  . Sulfa Antibiotics Rash    No current outpatient prescriptions on file as of 12/18/2010.   Medications Prior to Admission  Medication Dose Route Frequency Provider Last Rate Last Dose  . DISCONTD: 0.9 %  sodium chloride infusion   Intravenous Continuous Robet Leu, PA        No results found for this or any previous visit (from the past 48 hour(s)). No results found.  Review of Systems  Constitutional: Negative for fever.  Eyes: Negative for blurred vision and double vision.  Respiratory: Negative for cough.   Cardiovascular: Negative for chest pain.  Gastrointestinal: Negative for nausea, vomiting, abdominal pain and diarrhea.  Neurological: Negative for dizziness and headaches.    Blood pressure 110/77, pulse 111, temperature 100 F (37.8 C), temperature source Oral, resp. rate 22, height 5\' 4"  (1.626 m), weight 170 lb (77.111 kg), last menstrual period 11/22/2010, SpO2 99.00%. Physical Exam  Constitutional: She is oriented to person, place, and time. She appears well-developed and well-nourished.  HENT:  Head: Normocephalic and atraumatic.  Eyes: EOM are normal.  Neck: Normal range of motion.  Cardiovascular: Normal rate, regular rhythm and normal heart sounds.   No murmur heard. Respiratory: Effort normal and breath sounds normal. She has no wheezes.  GI: Soft. Bowel sounds are  normal. She exhibits no mass. There is no tenderness.  Musculoskeletal: Normal range of motion.       Rt leg pain Uses crutches occas  Neurological: She is alert and oriented to person, place, and time.  Skin: Skin is warm and dry.     Assessment/Plan Pt scheduled for Rt femur biopsy for bone lesion. R/O Avascular necrosis vs bone infarct. Pt understands procedure benefits and risks and agreeable to proceed. Consent signed.  Raul Winterhalter A 12/18/2010, 9:13 AM

## 2010-12-18 NOTE — Procedures (Signed)
CT core 11g R femur No complications Trivial blood loss

## 2011-04-30 IMAGING — US US BIOPSY
1 series · 8 of 8 positions shown · non-contrast
Comparison: none

[Series 1: us biopsy · 0.28mm/px · 8 of 8 slices shown]
[im 1/8]
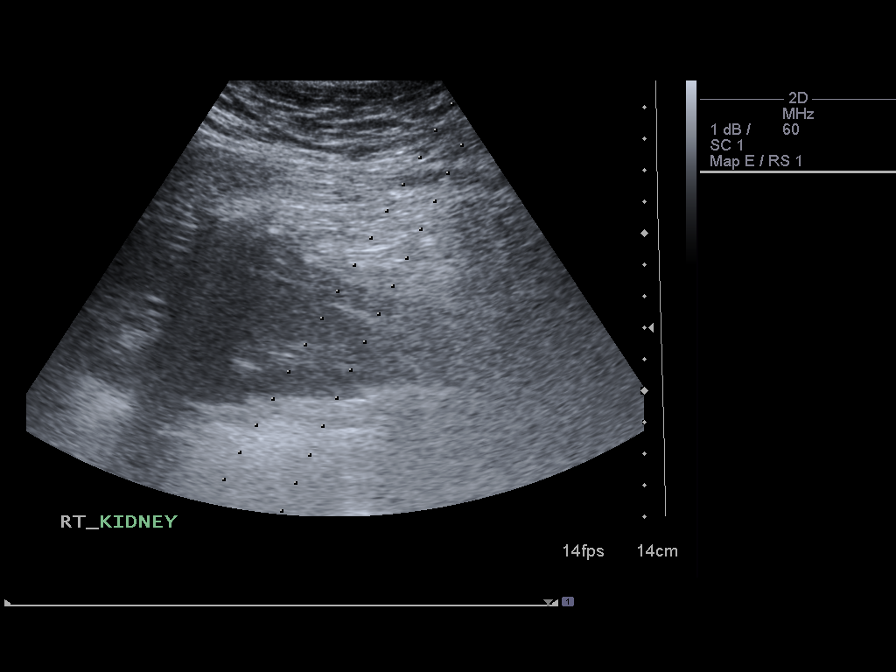
[im 2/8]
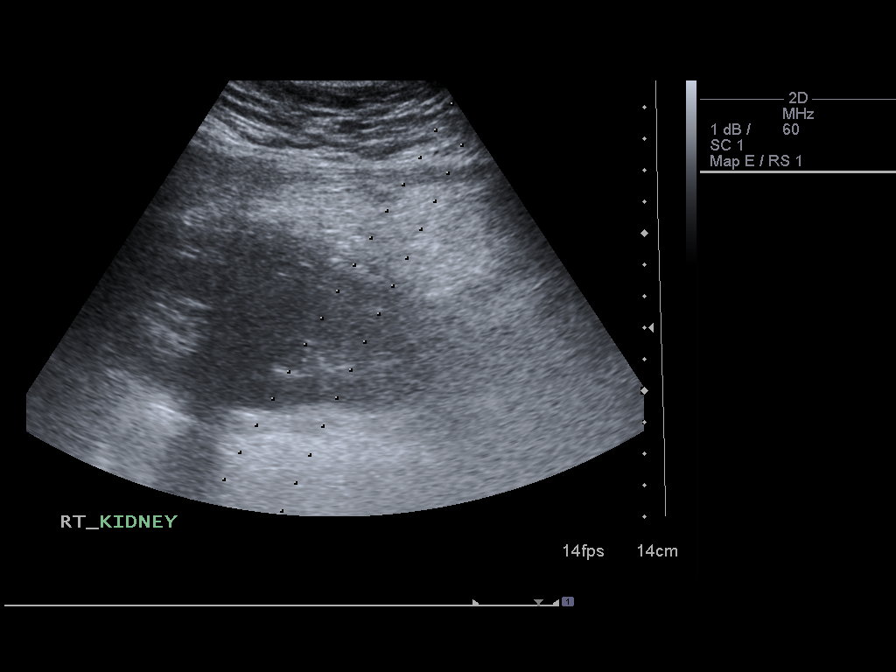
[im 3/8]
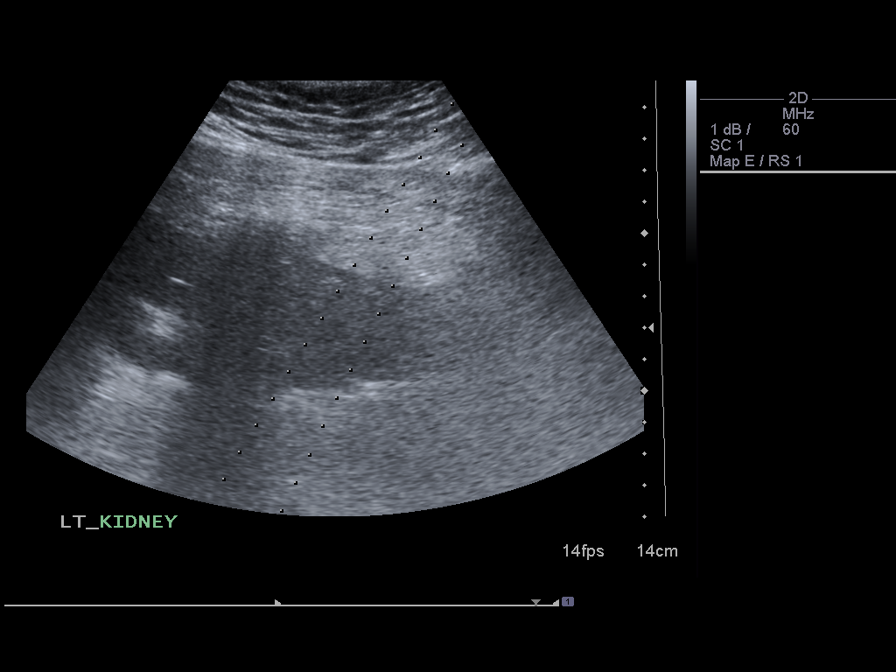
[im 4/8]
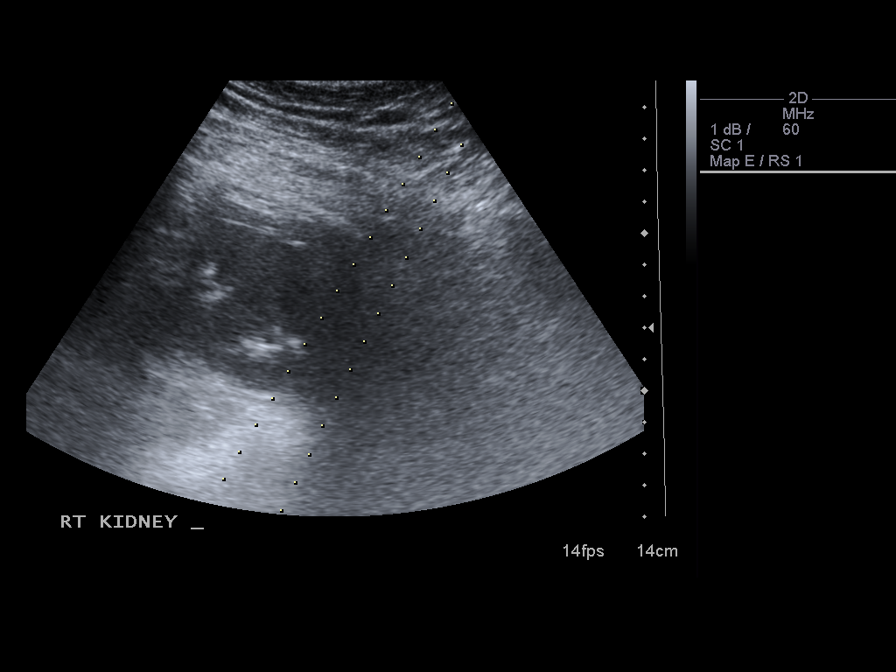
[im 5/8]
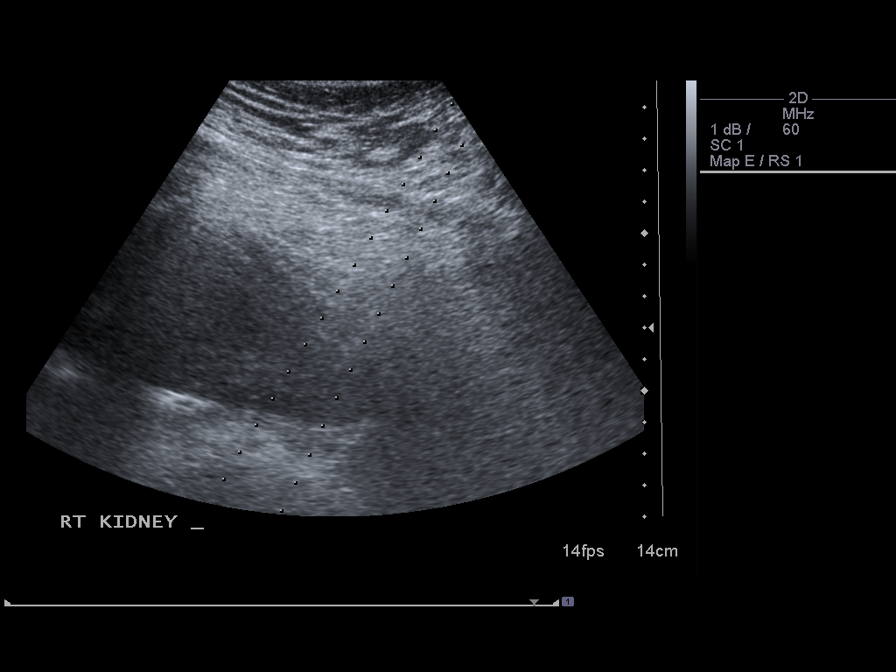
[im 6/8]
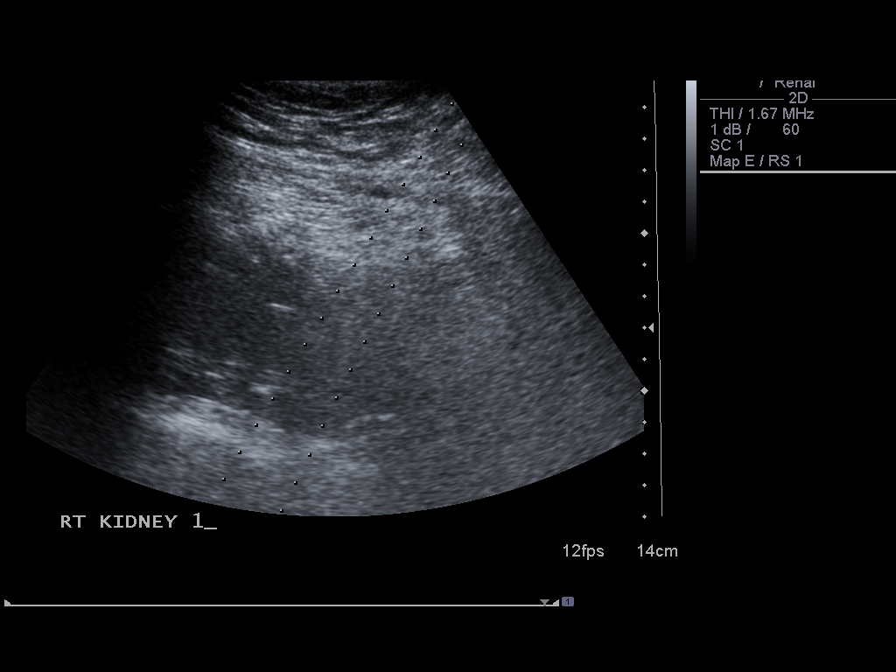
[im 7/8]
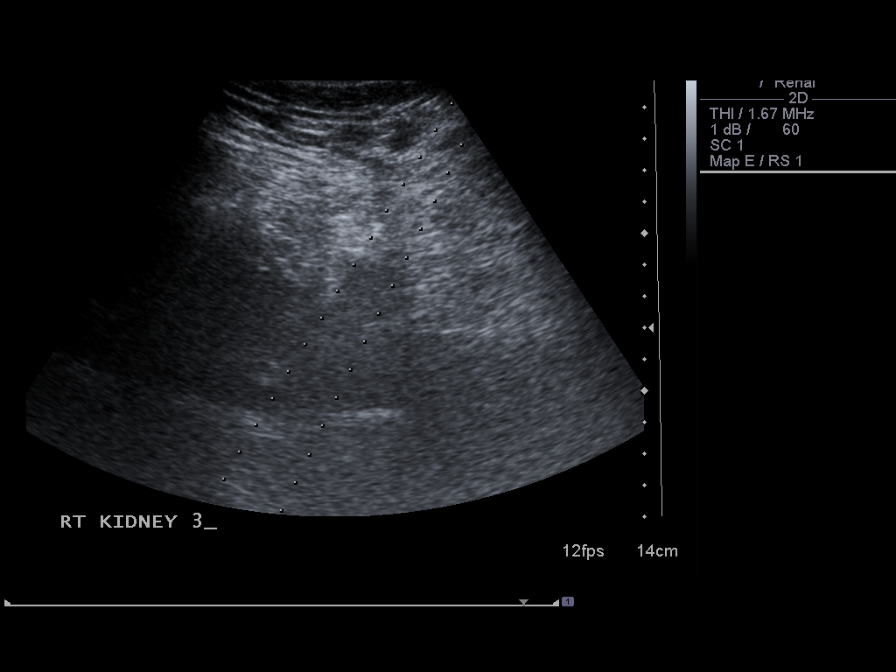
[im 8/8]
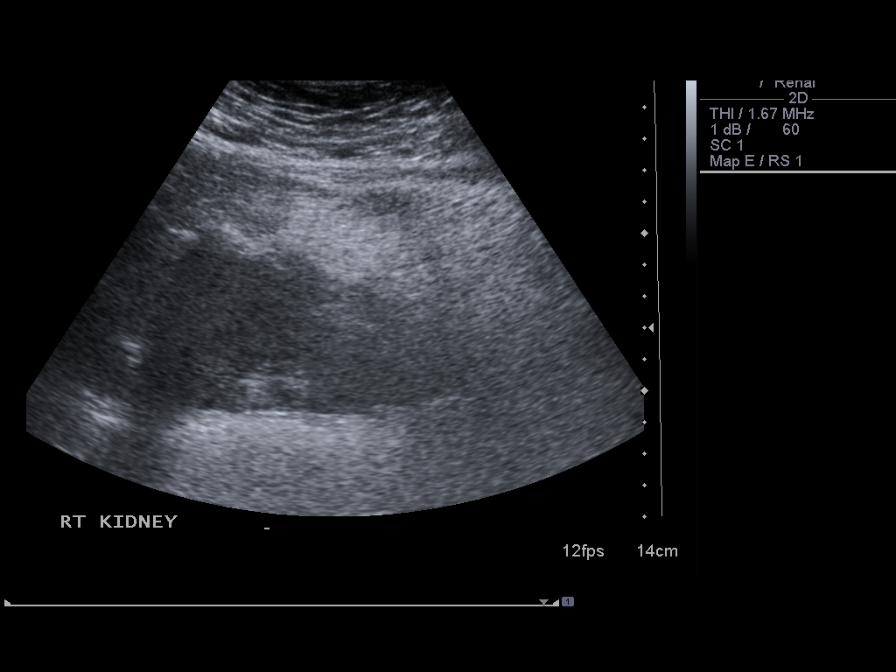

[8 of 8 positions shown; findings below may reference images not displayed]

US BIOPSY:

 EXAM PERFORMED, NO RADIOLOGICAL INTERPRETATION.

## 2011-06-19 ENCOUNTER — Encounter (HOSPITAL_COMMUNITY): Payer: Self-pay

## 2011-06-19 ENCOUNTER — Emergency Department (HOSPITAL_COMMUNITY)
Admission: EM | Admit: 2011-06-19 | Discharge: 2011-06-19 | Disposition: A | Discharge status: Home / Self Care | Payer: Self-pay | Attending: Emergency Medicine | Admitting: Emergency Medicine

## 2011-06-19 DIAGNOSIS — G8918 Other acute postprocedural pain: Secondary | ICD-10-CM

## 2011-06-19 DIAGNOSIS — M7989 Other specified soft tissue disorders: Secondary | ICD-10-CM | POA: Insufficient documentation

## 2011-06-19 DIAGNOSIS — M79609 Pain in unspecified limb: Secondary | ICD-10-CM | POA: Insufficient documentation

## 2011-06-19 DIAGNOSIS — R52 Pain, unspecified: Secondary | ICD-10-CM | POA: Insufficient documentation

## 2011-06-19 HISTORY — DX: Embolism and thrombosis of unspecified artery: I74.9

## 2011-06-19 MED ORDER — HYDROMORPHONE HCL PF 1 MG/ML IJ SOLN
1.0000 mg | Freq: Once | INTRAMUSCULAR | Status: AC
  Administered 2011-06-19: 1 mg via INTRAVENOUS
  Filled 2011-06-19: qty 1

## 2011-06-19 NOTE — ED Provider Notes (Addendum)
History     CSN: [id number]  Arrival date & time 06/19/11  1146   First MD Initiated Contact with Patient 06/19/11 1254      Chief Complaint  Patient presents with  . Leg Pain  . Leg Swelling    (Consider location/radiation/quality/duration/timing/severity/associated sxs/prior treatment) HPI Complains of right lateral leg pain and numbness in her foot onset after surgery 6 days ago however becoming progressively worse over the past 2 days. Patient admits to subjective fever and general malaise denies shortness of breath or chest pain no other complaint pain is worse with moving her knee. Treated with oxycodone, without adequate pain relief Past Medical History  Diagnosis Date  . Hypothyroidism   . Proteinuria 10/2008  . Lupus (systemic lupus erythematosus) 07/2009  . Embolism - blood clot     Past Surgical History  Procedure Date  . Thyroid lobectomy Left     Benign  . Knee surgery    post core decompression of right knee 6 days ago  Family History  Problem Relation Age of Onset  . Cancer Mother   . Hypertension Sister   . Asthma Brother     History  Substance Use Topics  . Smoking status: Never Smoker   . Smokeless tobacco: Not on file  . Alcohol Use: No    OB History    Grav Para Term Preterm Abortions TAB SAB Ect Mult Living                  Review of Systems  Constitutional: Positive for fever and chills.  HENT: Negative.   Respiratory: Negative.   Cardiovascular: Negative.   Gastrointestinal: Negative.   Musculoskeletal: Positive for arthralgias.  Skin: Negative.   Neurological: Negative.   Hematological: Negative.   Psychiatric/Behavioral: Negative.   All other systems reviewed and are negative.    Allergies  Aspirin; Codeine; and Sulfa antibiotics  Home Medications   Current Outpatient Rx  Name Route Sig Dispense Refill  . LEVOTHYROXINE SODIUM 88 MCG PO TABS Oral Take 88 mcg by mouth daily. Been off for a couple months. Was instructed to  resume after todays procedure.     Marland Kitchen MYCOPHENOLATE MOFETIL 500 MG PO TABS Oral Take 500 mg by mouth 2 (two) times daily.     . OXYCODONE HCL 5 MG PO TABS Oral Take 10 mg by mouth every 4 (four) hours as needed. For pain.    . WARFARIN SODIUM 10 MG PO TABS Oral Take 10 mg by mouth daily.      BP 120/72  Pulse 120  Temp(Src) 100 F (37.8 C) (Oral)  Resp 16  SpO2 100%  LMP 05/31/2011  Physical Exam  Nursing note and vitals reviewed. Constitutional: She appears well-developed and well-nourished.  HENT:  Head: Normocephalic and atraumatic.  Eyes: Conjunctivae are normal. Pupils are equal, round, and reactive to light.  Neck: Neck supple. No tracheal deviation present. No thyromegaly present.  Cardiovascular:  No murmur heard.      Tachycardic  Pulmonary/Chest: Effort normal and breath sounds normal.  Abdominal: Soft. Bowel sounds are normal. She exhibits no distension. There is no tenderness.  Musculoskeletal: Normal range of motion. She exhibits no edema and no tenderness.       Right lower extremity sutured wounds above and below knee, lateral aspect with corresponding tenderness at lateral aspect of distal thigh and proximal knee, lateral aspect DP and PT pulses are 2+ good capillary refill no cords no Homans sign no calf or posterior thigh  tenderness  Neurological: She is alert. Coordination normal.  Skin: Skin is warm and dry. No rash noted.  Psychiatric: She has a normal mood and affect.    ED Course  Procedures (including critical care time)  Labs Reviewed - No data to display No results found.   No diagnosis found.    MDM  Doubt DVT based on physical exam And location of pain. Patient exhibits no evidence of vascular compromise Spoke with Dr. Emelda Fear orthopedist at [facility] Plan transfer to [facility] emergency department Dr.Manthey is accepting physician Diagnosis postoperative pain        Doug Sou, MD 06/19/11 1422  4:25 PM  patient requests an additional dose of pain medicine prior to transfer she is alert appropriate additional dilaudid ordered  Doug Sou, MD 06/19/11 1628

## 2011-06-19 NOTE — ED Notes (Signed)
Patient reports  That she had surgery on her right leg 6 days ago and began having severe pain and swelling last night that has gotten progressively worse. Patient has a history of blood clots. Patient has had a low grade fever at home.

## 2011-08-26 ENCOUNTER — Encounter (HOSPITAL_COMMUNITY): Payer: Self-pay | Admitting: *Deleted

## 2011-08-26 ENCOUNTER — Emergency Department (HOSPITAL_COMMUNITY)
Admission: EM | Admit: 2011-08-26 | Discharge: 2011-08-26 | Disposition: A | Discharge status: Home / Self Care | Payer: Self-pay | Attending: Emergency Medicine | Admitting: Emergency Medicine

## 2011-08-26 DIAGNOSIS — Z825 Family history of asthma and other chronic lower respiratory diseases: Secondary | ICD-10-CM | POA: Insufficient documentation

## 2011-08-26 DIAGNOSIS — H00011 Hordeolum externum right upper eyelid: Secondary | ICD-10-CM

## 2011-08-26 DIAGNOSIS — H00019 Hordeolum externum unspecified eye, unspecified eyelid: Secondary | ICD-10-CM | POA: Insufficient documentation

## 2011-08-26 DIAGNOSIS — Z86711 Personal history of pulmonary embolism: Secondary | ICD-10-CM | POA: Insufficient documentation

## 2011-08-26 DIAGNOSIS — Z8249 Family history of ischemic heart disease and other diseases of the circulatory system: Secondary | ICD-10-CM | POA: Insufficient documentation

## 2011-08-26 DIAGNOSIS — E039 Hypothyroidism, unspecified: Secondary | ICD-10-CM | POA: Insufficient documentation

## 2011-08-26 DIAGNOSIS — Z7901 Long term (current) use of anticoagulants: Secondary | ICD-10-CM | POA: Insufficient documentation

## 2011-08-26 DIAGNOSIS — M329 Systemic lupus erythematosus, unspecified: Secondary | ICD-10-CM | POA: Insufficient documentation

## 2011-08-26 MED ORDER — ERYTHROMYCIN 5 MG/GM OP OINT
TOPICAL_OINTMENT | Freq: Three times a day (TID) | OPHTHALMIC | Status: DC
  Administered 2011-08-26: 12:00:00 via OPHTHALMIC
  Filled 2011-08-26: qty 3.5

## 2011-08-26 MED ORDER — ACETAMINOPHEN 500 MG PO TABS
1000.0000 mg | ORAL_TABLET | Freq: Once | ORAL | Status: AC
  Administered 2011-08-26: 1000 mg via ORAL
  Filled 2011-08-26: qty 2

## 2011-08-26 MED ORDER — PROPARACAINE HCL 0.5 % OP SOLN
1.0000 [drp] | Freq: Once | OPHTHALMIC | Status: DC
  Filled 2011-08-26: qty 15

## 2011-08-26 NOTE — ED Provider Notes (Signed)
Medical screening examination/treatment/procedure(s) were conducted as a shared visit with non-physician practitioner(s) and myself.  I personally evaluated the patient during the encounter  Patient seen with 2 days of likely stye formation in her right eye.  She noted it started at the corner of her eye and now she has some mild redness and swelling with discomfort along her lid margin.  There is no focal area of pus or drainage.  Patient has normal extraocular eye movements.  Visual acuities have that checked.  Patient has been counseled regarding using warm compresses as well as washing her eyelid margin gently with a 50/50 mix of baby shampoo and water with a Q-tip.  I've also recommended that she followup with ophthalmology if she continues to have stye's recurrently.  Patient has been given precautions to return for worsening redness, swelling or changes in vision.  Nat Christen, MD 08/26/11 [phone]

## 2011-08-26 NOTE — ED Provider Notes (Signed)
History     CSN: [id number]  Arrival date & time 08/26/11  1002   First MD Initiated Contact with Patient 08/26/11 1116      Chief Complaint  Patient presents with  . 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 Patient reports her R eyelid has been swelling since yesterday. Endorse pain behind and surrounding eye with watery drainage.  Does endorse pain with eye movement, and having double vision.  Denies fever, headache, or rash.  Denies recent trauma.  Does not wear contact lens.   Sts she has had similar sxs like this at least 4 times in the past and usually was treated with abx eye ointment and warm compress for treatment of stye.   Past Medical History  Diagnosis Date  . Hypothyroidism   . Proteinuria 10/2008  . Lupus (systemic lupus erythematosus) 07/2009  . Embolism - blood clot     Past Surgical History  Procedure Date  . Thyroid lobectomy Left     Benign  . Knee surgery     Family History  Problem Relation Age of Onset  . Cancer Mother   . Hypertension Sister   . Asthma Brother     History  Substance Use Topics  . Smoking status: Never Smoker   . Smokeless tobacco: Not on file  . Alcohol Use: No    OB History    Grav Para Term Preterm Abortions TAB SAB Ect Mult Living                  Review of Systems  All other systems reviewed and are negative.    Allergies  Aspirin; Codeine; and Sulfa antibiotics  Home Medications   Current Outpatient Rx  Name Route Sig Dispense Refill  . LEVOTHYROXINE SODIUM 88 MCG PO TABS Oral Take 88 mcg by mouth daily.     Marland Kitchen MYCOPHENOLATE MOFETIL 500 MG PO TABS Oral Take 500 mg by mouth 2 (two) times daily.     . WARFARIN SODIUM 5 MG PO TABS Oral Take 5 mg by mouth daily. Takes 1 tablet only on mondays and fridays for 5mg   dosage    . WARFARIN SODIUM 7.5 MG PO TABS Oral Take 7.5 mg by mouth daily. Takes 1 tablet on sundays, tuesdays, wednesdays, thursdays, and saturdays for 7.5mg  dosage      BP 114/78  Pulse 101  Temp 99.1 F (37.3 C) (Oral)  Resp 14  SpO2 100%  LMP 08/08/2011  Physical Exam  Nursing note and vitals reviewed. Constitutional: She is oriented to person, place, and time. She appears well-developed and well-nourished. No distress.  HENT:  Head: Normocephalic and atraumatic.  Eyes: Conjunctivae and EOM are normal. Pupils are equal, round, and reactive to light. No foreign bodies found. Right eye exhibits hordeolum. Right eye exhibits no chemosis, no discharge and no exudate. No foreign body present in the right eye. Left eye exhibits no chemosis, no discharge, no exudate and no hordeolum. No foreign body present in the left eye. Right conjunctiva is not injected. Right conjunctiva has no hemorrhage. Left conjunctiva is not injected. Left conjunctiva has no hemorrhage. No scleral icterus.       Moderate edema to R upper eye lid with mild pustular discharge towards medial aspect of lid consistent with stye.  No fb seen or palpated.  Normal field of gaze, visual acuity mildly diminished  due to swelling of eye lid.    Neck: Neck supple.  Neurological: She is alert and oriented to person, place, and time.  Skin: Skin is warm. No rash noted.  Psychiatric: She has a normal mood and affect.    ED Course  Procedures (including critical care time)  Labs Reviewed - No data to display No results found.   No diagnosis found.  1. Stye in R eye.    MDM  R eyelid swelling consistent with stye.  No significant visual changes, 20/40 R eye, 20/30 L eye.  I used tonopen but unable to get an accurate pressure as pt having discomfort with pain in her eyelid. Doubt acute glaucoma.  Pt has similar sxs 4 times before.    Will give erythromycin ointment, along with home care with warm compress,  [employer] shampoo/water to clean eye.  Pt to f/u with opthalmologist for further care.  Strict return precaution discussed.          Fayrene Helper, PA-C 08/26/11 1242

## 2011-08-26 NOTE — ED Notes (Signed)
Pt reports R eye swelling since yesterday. Sts has happened before, normally lasts a week and goes away on its own. Sts pain and swelling worse this time. Watery drainage yesterday.

## 2011-12-02 IMAGING — CT CT ANGIO CHEST
2 of 6 series · 19 of 46 positions shown · IV contrast (APPLIED)
Comparison: CT angio chest 04/28/2009 and 10/07/2008.

CLINICAL DATA: Right lower extremity swelling.  Chest pain
radiating to the back.  Prior history of pulmonary embolus.
History of lupus.

CT ANGIOGRAPHY CHEST WITH CONTRAST  01/30/2010:
TECHNIQUE: Multidetector CT imaging of the chest was performed
using the standard protocol during bolus administration of
intravenous contrast.  Multiplanar CT image reconstructions
including MIPs were obtained to evaluate the vascular anatomy.
Contrast:  80 ml [id number] IV.

[Series 9: pe thins @ 1mm · axial · 0.62mm/px · z∈[-239,+12]mm · 16 of 275 slices shown]
[im 12/275  lung]
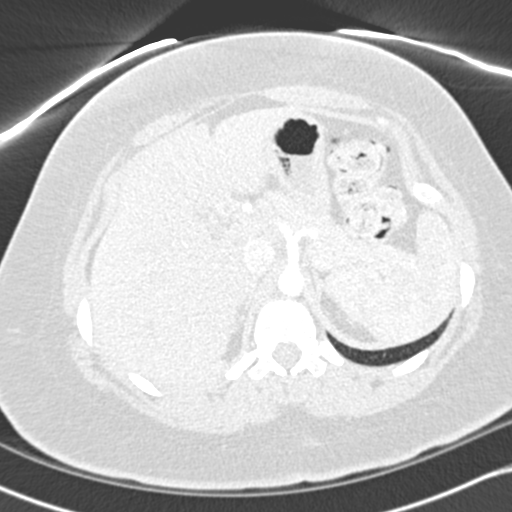
[im 36/275  soft-tissue]
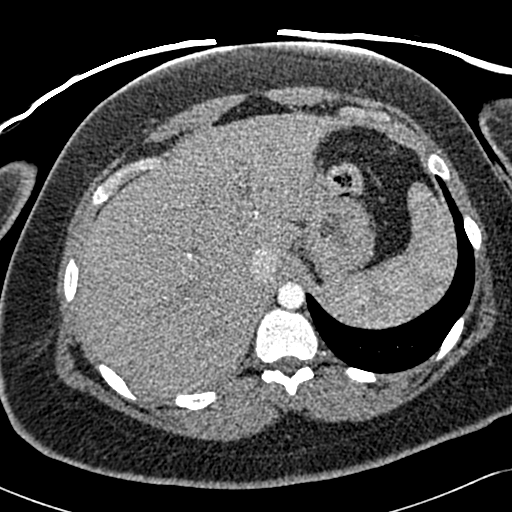
[im 48/275  lung]
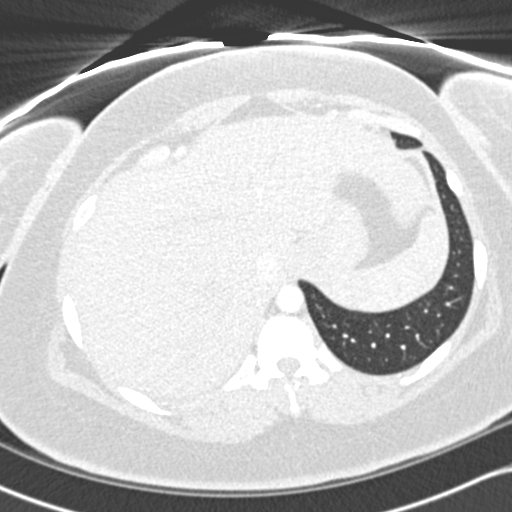
[im 60/275  soft-tissue]
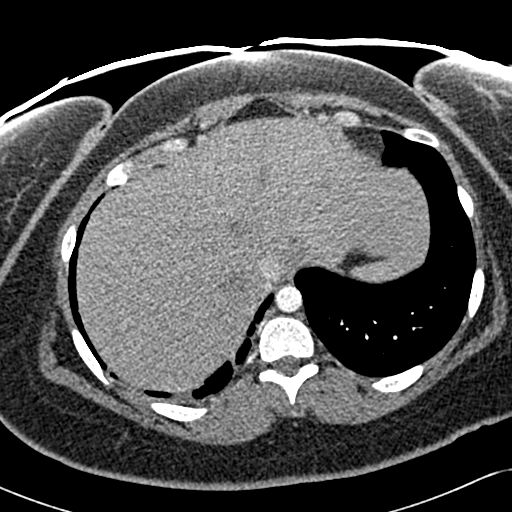
[im 84/275  lung]
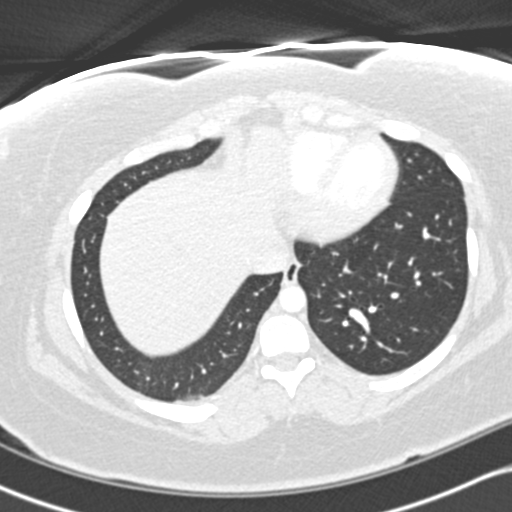
[im 96/275  soft-tissue]
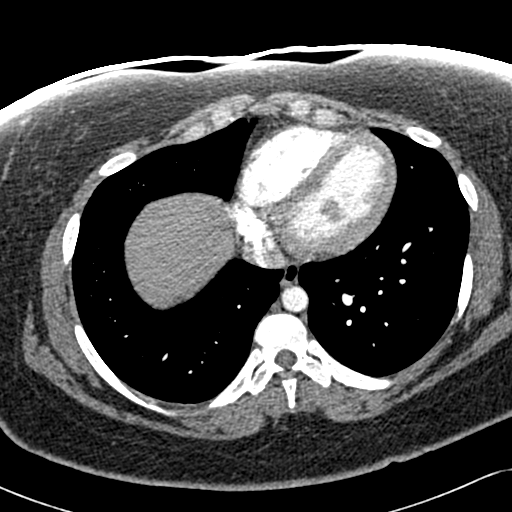
[im 108/275  lung]
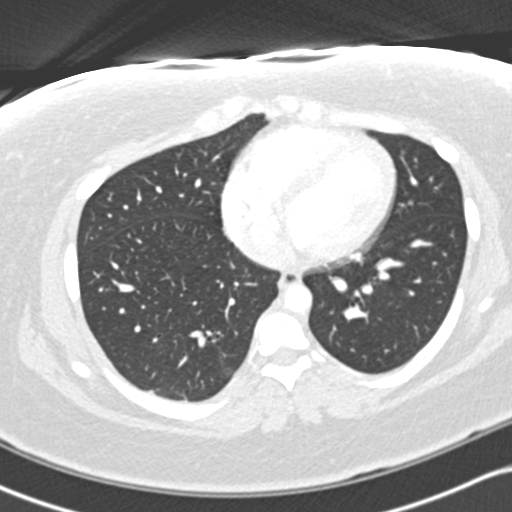
[im 132/275  soft-tissue]
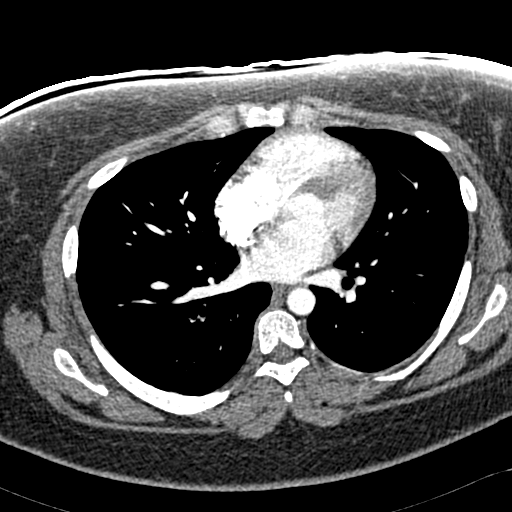
[im 143/275  lung]
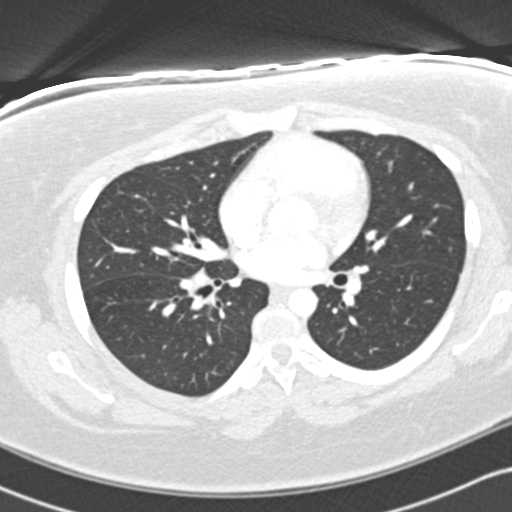
[im 167/275  soft-tissue]
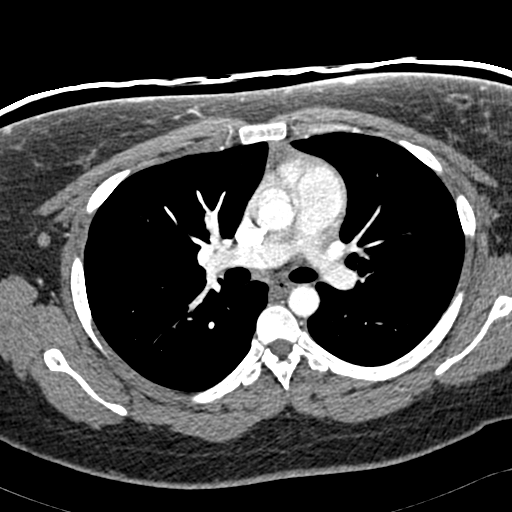
[im 179/275  lung]
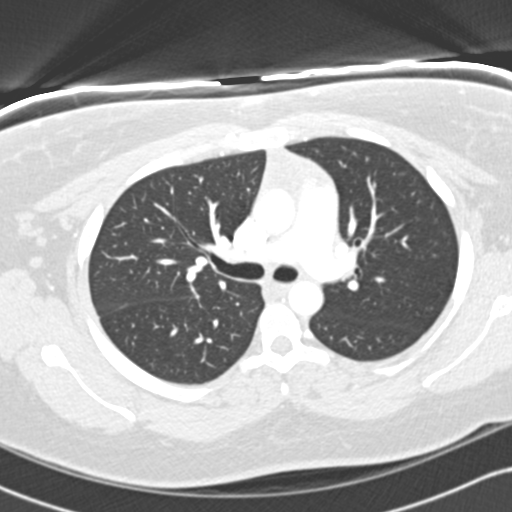
[im 191/275  soft-tissue]
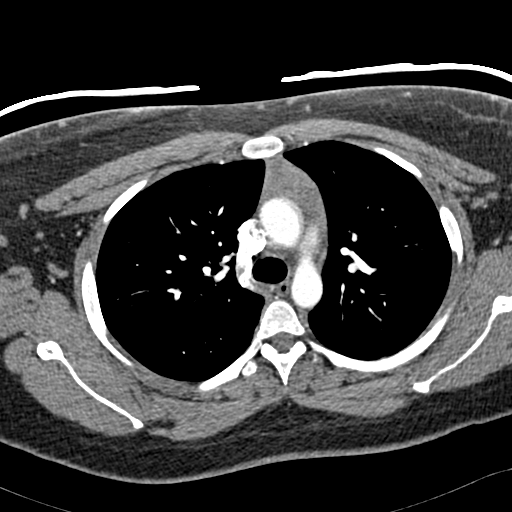
[im 215/275  lung]
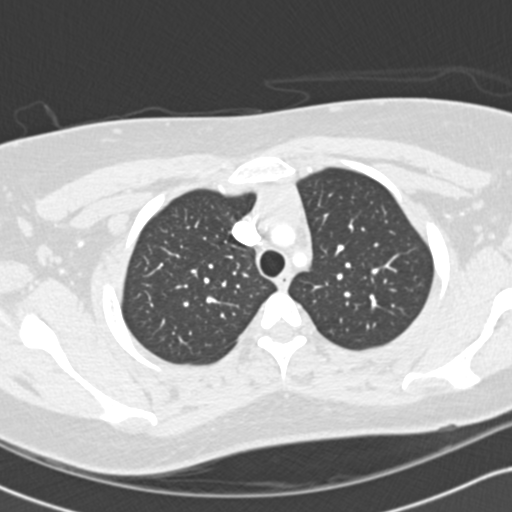
[im 227/275  soft-tissue]
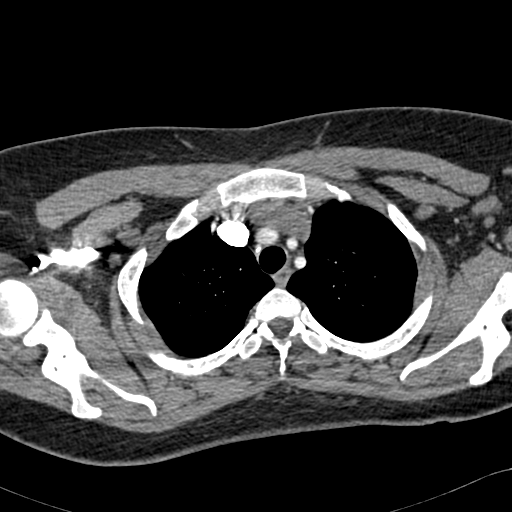
[im 239/275  lung]
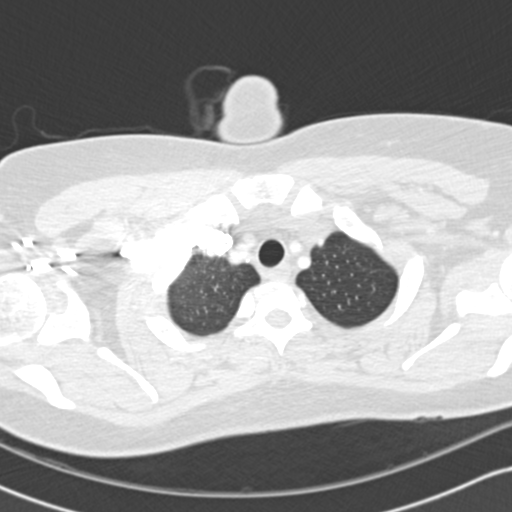
[im 263/275  soft-tissue]
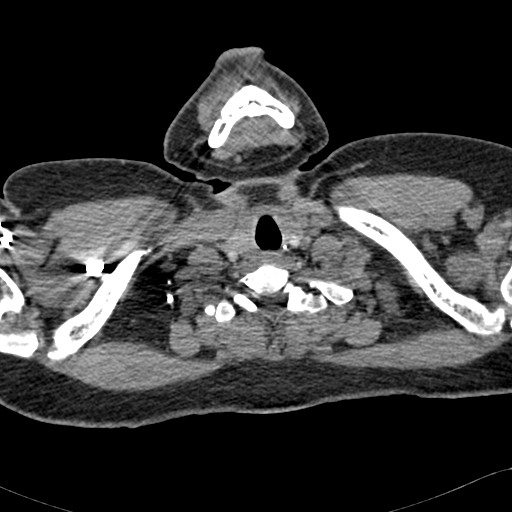

[Series 604: <mpr thick range> · coronal · 0.62mm/px · 3 of 93 slices shown]
[im 24/93  soft-tissue]
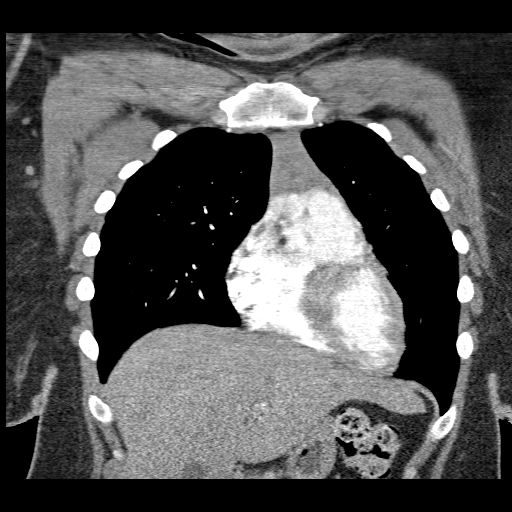
[im 47/93  soft-tissue]
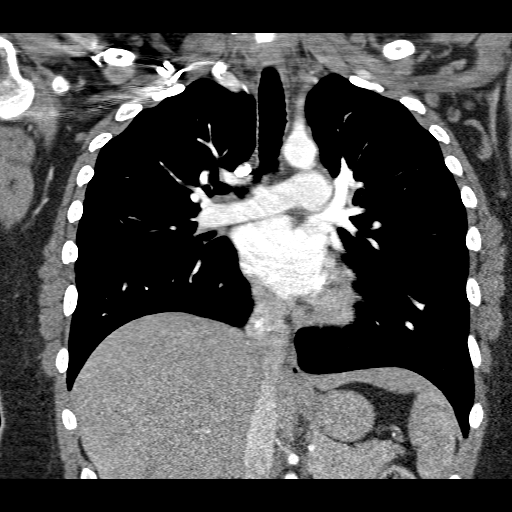
[im 70/93  soft-tissue]
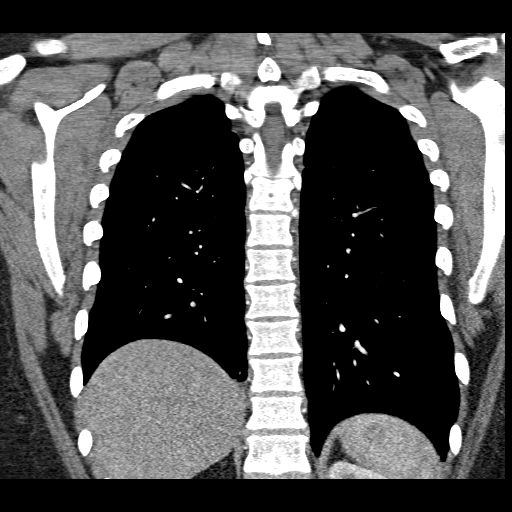

[19 of 46 positions shown; findings below may reference images not displayed]

FINDINGS: Contrast opacification of pulmonary arteries is
moderate.  No filling defects within either main pulmonary artery
or their branches in either lung currently to suggest acute
pulmonary embolism.  No visible pulmonary arterial stenoses to
suggest chronic thromboembolic disease.  Heart size normal and
stable.  No pericardial effusion.  No visible atherosclerosis
involving the thoracic or upper abdominal aorta.

Minimal atelectasis in the right lower lobe.  Lungs otherwise clear
without localized airspace consolidation, interstitial disease, or
nodularity.  No pleural effusions.  Central airways patent without
significant bronchial wall thickening.

Residual thymic tissue in the anterior-superior mediastinum.
Interval left hemithyroidectomy.  Visualized upper abdomen
unremarkable for the early arterial phase of enhancement.  Bone
window images unremarkable.

Review of the MIP images confirms the above findings.
IMPRESSION: 1.  No evidence of acute pulmonary embolism.
2.  Mild atelectasis in the right lower lobe.  No acute
cardiopulmonary disease otherwise.

## 2012-07-28 ENCOUNTER — Encounter: Payer: Self-pay | Admitting: Obstetrics & Gynecology

## 2012-09-26 ENCOUNTER — Ambulatory Visit: Payer: Self-pay | Admitting: Obstetrics & Gynecology

## 2012-12-14 ENCOUNTER — Emergency Department (HOSPITAL_COMMUNITY): Payer: BC Managed Care – PPO

## 2012-12-14 ENCOUNTER — Encounter (HOSPITAL_COMMUNITY): Payer: Self-pay | Admitting: Emergency Medicine

## 2012-12-14 ENCOUNTER — Emergency Department (HOSPITAL_COMMUNITY)
Admission: EM | Admit: 2012-12-14 | Discharge: 2012-12-14 | Disposition: A | Discharge status: Home / Self Care | Payer: BC Managed Care – PPO | Attending: Emergency Medicine | Admitting: Emergency Medicine

## 2012-12-14 DIAGNOSIS — R05 Cough: Secondary | ICD-10-CM | POA: Insufficient documentation

## 2012-12-14 DIAGNOSIS — R509 Fever, unspecified: Secondary | ICD-10-CM | POA: Insufficient documentation

## 2012-12-14 DIAGNOSIS — Z8739 Personal history of other diseases of the musculoskeletal system and connective tissue: Secondary | ICD-10-CM | POA: Insufficient documentation

## 2012-12-14 DIAGNOSIS — E039 Hypothyroidism, unspecified: Secondary | ICD-10-CM | POA: Insufficient documentation

## 2012-12-14 DIAGNOSIS — L0231 Cutaneous abscess of buttock: Secondary | ICD-10-CM | POA: Insufficient documentation

## 2012-12-14 DIAGNOSIS — Z79899 Other long term (current) drug therapy: Secondary | ICD-10-CM | POA: Insufficient documentation

## 2012-12-14 DIAGNOSIS — R5381 Other malaise: Secondary | ICD-10-CM | POA: Insufficient documentation

## 2012-12-14 DIAGNOSIS — Z86718 Personal history of other venous thrombosis and embolism: Secondary | ICD-10-CM | POA: Insufficient documentation

## 2012-12-14 DIAGNOSIS — R0602 Shortness of breath: Secondary | ICD-10-CM | POA: Insufficient documentation

## 2012-12-14 DIAGNOSIS — Z975 Presence of (intrauterine) contraceptive device: Secondary | ICD-10-CM | POA: Insufficient documentation

## 2012-12-14 DIAGNOSIS — R079 Chest pain, unspecified: Secondary | ICD-10-CM | POA: Insufficient documentation

## 2012-12-14 DIAGNOSIS — Z792 Long term (current) use of antibiotics: Secondary | ICD-10-CM | POA: Insufficient documentation

## 2012-12-14 DIAGNOSIS — R059 Cough, unspecified: Secondary | ICD-10-CM | POA: Insufficient documentation

## 2012-12-14 LAB — CBC WITH DIFFERENTIAL/PLATELET
Basophils Absolute: 0 10*3/uL (ref 0.0–0.1)
Basophils Relative: 0 % (ref 0–1)
Eosinophils Absolute: 0 10*3/uL (ref 0.0–0.7)
Eosinophils Relative: 0 % (ref 0–5)
MCH: 29.8 pg (ref 26.0–34.0)
MCHC: 33.6 g/dL (ref 30.0–36.0)
MCV: 88.7 fL (ref 78.0–100.0)
Monocytes Absolute: 0.6 10*3/uL (ref 0.1–1.0)
Platelets: 217 10*3/uL (ref 150–400)
RDW: 11.4 % — ABNORMAL LOW (ref 11.5–15.5)
WBC: 5.7 10*3/uL (ref 4.0–10.5)

## 2012-12-14 LAB — BASIC METABOLIC PANEL
Calcium: 9.3 mg/dL (ref 8.4–10.5)
Creatinine, Ser: 0.86 mg/dL (ref 0.50–1.10)
GFR calc non Af Amer: >90 mL/min (ref >90)
Sodium: 136 mEq/L (ref 135–145)

## 2012-12-14 MED ORDER — CEPHALEXIN 500 MG PO CAPS: 500.0000 mg | ORAL_CAPSULE | Freq: Four times a day (QID) | ORAL | Status: DC

## 2012-12-14 MED ORDER — DOXYCYCLINE HYCLATE 100 MG PO CAPS: 100.0000 mg | ORAL_CAPSULE | Freq: Two times a day (BID) | ORAL | Status: DC

## 2012-12-14 MED ORDER — HYDROCODONE-ACETAMINOPHEN 5-325 MG PO TABS: 2.0000 | ORAL_TABLET | ORAL | Status: DC | PRN

## 2012-12-14 MED ORDER — VANCOMYCIN HCL IN DEXTROSE 1-5 GM/200ML-% IV SOLN
1000.0000 mg | Freq: Once | INTRAVENOUS | Status: AC
  Administered 2012-12-14: 1000 mg via INTRAVENOUS
  Filled 2012-12-14: qty 200

## 2012-12-14 MED ORDER — MORPHINE SULFATE 4 MG/ML IJ SOLN
4.0000 mg | INTRAMUSCULAR | Status: DC | PRN
  Administered 2012-12-14: 4 mg via INTRAVENOUS
  Filled 2012-12-14: qty 1

## 2012-12-14 MED ORDER — ONDANSETRON HCL 4 MG/2ML IJ SOLN
4.0000 mg | Freq: Once | INTRAMUSCULAR | Status: AC
  Administered 2012-12-14: 4 mg via INTRAVENOUS
  Filled 2012-12-14: qty 2

## 2012-12-14 NOTE — ED Notes (Signed)
Pt c/o abscess to lt buttocks, cough, chest/back/buttocks pain since sunday

## 2012-12-14 NOTE — Progress Notes (Signed)
Patient confirms her pcp is Dr. Tracey Harries of [facility].  System updated.

## 2012-12-16 NOTE — ED Provider Notes (Signed)
CSN: [id number]     Arrival date & time 12/14/12  1304 History   First MD Initiated Contact with Patient 12/14/12 1316     Chief Complaint  Patient presents with  . Abscess    buttocks  . Cough  . Chest Pain    HPI  Patient presents with feeling ill for the last 2 days. Bodyaches fevers and chills. Has increasing pain and area is red and swollen and tender in her left buttock. Significant history for lupus. She is immunocompromised taking CellCept. Occasional cough. Mild headache. No vomiting.  Past Medical History  Diagnosis Date  . Hypothyroidism   . Proteinuria 10/2008  . Lupus (systemic lupus erythematosus) 07/2009  . Embolism - blood clot    Past Surgical History  Procedure Laterality Date  . Thyroid lobectomy  Left     Benign  . Knee surgery     Family History  Problem Relation Age of Onset  . Cancer Mother   . Hypertension Sister   . Asthma Brother    History  Substance Use Topics  . Smoking status: Never Smoker   . Smokeless tobacco: Not on file  . Alcohol Use: No   OB History   Grav Para Term Preterm Abortions TAB SAB Ect Mult Living                 Review of Systems  Constitutional: Positive for fever and fatigue. Negative for chills, diaphoresis and appetite change.  HENT: Negative for mouth sores, sore throat and trouble swallowing.   Eyes: Negative for visual disturbance.  Respiratory: Positive for cough and shortness of breath. Negative for chest tightness and wheezing.   Cardiovascular: Negative for chest pain.  Gastrointestinal: Negative for nausea, vomiting, abdominal pain, diarrhea and abdominal distention.  Endocrine: Negative for polydipsia, polyphagia and polyuria.  Genitourinary: Negative for dysuria, frequency and hematuria.  Musculoskeletal: Negative for gait problem.  Skin: Negative for color change, pallor and rash.       Tender red swollen area left buttock  Neurological: Negative for dizziness, syncope, light-headedness and headaches.   Hematological: Does not bruise/bleed easily.  Psychiatric/Behavioral: Negative for behavioral problems and confusion.    Allergies  Aspirin; Codeine; and Sulfa antibiotics  Home Medications   Current Outpatient Rx  Name  Route  Sig  Dispense  Refill  . acetaminophen (TYLENOL) 500 MG tablet   Oral   Take 1,000 mg by mouth every 6 (six) hours as needed.         Marland Kitchen amLODipine (NORVASC) 5 MG tablet   Oral   Take 5 mg by mouth daily.         Marland Kitchen levonorgestrel (MIRENA) 20 MCG/24HR IUD   Intrauterine   1 each by Intrauterine route once. September 2014         . levothyroxine (SYNTHROID, LEVOTHROID) 88 MCG tablet   Oral   Take 88 mcg by mouth daily.          . mycophenolate (CELLCEPT) 500 MG tablet   Oral   Take 500 mg by mouth 2 (two) times daily.          . cephALEXin (KEFLEX) 500 MG capsule   Oral   Take 1 capsule (500 mg total) by mouth 4 (four) times daily.   40 capsule   0   . doxycycline (VIBRAMYCIN) 100 MG capsule   Oral   Take 1 capsule (100 mg total) by mouth 2 (two) times daily.   20 capsule   0   .  HYDROcodone-acetaminophen (NORCO/VICODIN) 5-325 MG per tablet   Oral   Take 2 tablets by mouth every 4 (four) hours as needed.   10 tablet   0    BP 121/83  Pulse 99  Temp(Src) 98.2 F (36.8 C) (Oral)  Resp 21  SpO2 100%  LMP 12/14/2012 Physical Exam  Constitutional: She is oriented to person, place, and time. She appears well-developed and well-nourished. No distress.  HENT:  Head: Normocephalic.  Eyes: Conjunctivae are normal. Pupils are equal, round, and reactive to light. No scleral icterus.  Neck: Normal range of motion. Neck supple. No thyromegaly present.  Cardiovascular: Normal rate and regular rhythm.  Exam reveals no gallop and no friction rub.   No murmur heard. Pulmonary/Chest: Effort normal and breath sounds normal. No respiratory distress. She has no wheezes. She has no rales.  No abnormal breath sounds. Clear lungs.  Abdominal:  Soft. Bowel sounds are normal. She exhibits no distension. There is no tenderness. There is no rebound.  Musculoskeletal: Normal range of motion.  Neurological: She is alert and oriented to person, place, and time.  Skin: Skin is warm and dry. No rash noted.     Psychiatric: She has a normal mood and affect. Her behavior is normal.    ED Course  INCISION AND DRAINAGE Performed by: Roney Marion Authorized by: Rolland Porter J Consent: Verbal consent obtained. written consent not obtained. Risks and benefits: risks, benefits and alternatives were discussed Consent given by: patient Patient understanding: patient states understanding of the procedure being performed Patient identity confirmed: verbally with patient Type: abscess Body area: trunk (Buttock) Anesthesia: local infiltration Local anesthetic: lidocaine 2% with epinephrine Anesthetic total: 5 ml Scalpel size: 10 Incision type: single straight Complexity: simple Drainage: purulent Drainage amount: moderate Packing material: 1/4 in gauze Patient tolerance: Patient tolerated the procedure well with no immediate complications.   (including critical care time) Labs Review Labs Reviewed  CBC WITH DIFFERENTIAL - Abnormal; Notable for the following:    RDW 11.4 (*)    All other components within normal limits  CULTURE, ROUTINE-ABSCESS  BASIC METABOLIC PANEL   Imaging Review Dg Chest 2 View  12/14/2012   CLINICAL DATA:  27 year old female with cough. Buttock abscess. Pain. Initial encounter.  EXAM: CHEST  2 VIEW  COMPARISON:  05/03/2009 and earlier.  FINDINGS: Semi upright AP and lateral views of the chest. Improved lung volumes. Normal cardiac size and mediastinal contours. Visualized tracheal air column is within normal limits. The lungs are clear. No pneumothorax or effusion. No acute osseous abnormality identified.  IMPRESSION: Negative, no acute cardiopulmonary abnormality.   Electronically Signed   By: Augusto Gamble M.D.    On: 12/14/2012 15:14    EKG Interpretation   None       MDM   1. Left buttock abscess    Limited bedside ultrasound shows obvious fluid collection with anechoic fluid consistent with abscess. Ideas and performed without difficulty. Cavity was irrigated and others are clear effluent. Is probed internal loculations present packed with 60 inches of iodoform gauze. She's had prior abscesses before her mother is comfortable with here at home. She was given scissors and forceps. I have asked her to remove 1 inch per day until all packing is out out. Recheck when necessary and with any failure to improve or worsening symptoms. Given vancomycin 1 g IV in the emergency room. She is prescribed doxycycline, Keflex and Vicodin for pain.    Roney Marion, MD 12/16/12 [phone]

## 2012-12-17 LAB — CULTURE, ROUTINE-ABSCESS: Gram Stain: NONE SEEN

## 2013-08-25 ENCOUNTER — Encounter (HOSPITAL_COMMUNITY): Payer: Self-pay | Admitting: Emergency Medicine

## 2013-08-25 ENCOUNTER — Emergency Department (HOSPITAL_COMMUNITY)
Admission: EM | Admit: 2013-08-25 | Discharge: 2013-08-25 | Disposition: A | Discharge status: Home / Self Care | Payer: BC Managed Care – PPO | Attending: Emergency Medicine | Admitting: Emergency Medicine

## 2013-08-25 DIAGNOSIS — Z792 Long term (current) use of antibiotics: Secondary | ICD-10-CM | POA: Insufficient documentation

## 2013-08-25 DIAGNOSIS — Z8679 Personal history of other diseases of the circulatory system: Secondary | ICD-10-CM | POA: Insufficient documentation

## 2013-08-25 DIAGNOSIS — J029 Acute pharyngitis, unspecified: Secondary | ICD-10-CM | POA: Insufficient documentation

## 2013-08-25 DIAGNOSIS — J020 Streptococcal pharyngitis: Secondary | ICD-10-CM | POA: Insufficient documentation

## 2013-08-25 DIAGNOSIS — Z79899 Other long term (current) drug therapy: Secondary | ICD-10-CM | POA: Insufficient documentation

## 2013-08-25 DIAGNOSIS — Z872 Personal history of diseases of the skin and subcutaneous tissue: Secondary | ICD-10-CM | POA: Insufficient documentation

## 2013-08-25 DIAGNOSIS — E039 Hypothyroidism, unspecified: Secondary | ICD-10-CM | POA: Insufficient documentation

## 2013-08-25 MED ORDER — HYDROCODONE-HOMATROPINE 5-1.5 MG/5ML PO SYRP: 5.0000 mL | ORAL_SOLUTION | Freq: Four times a day (QID) | ORAL | Status: DC | PRN

## 2013-08-25 MED ORDER — AMOXICILLIN 500 MG PO CAPS: 500.0000 mg | ORAL_CAPSULE | Freq: Three times a day (TID) | ORAL | Status: DC

## 2013-08-25 MED ORDER — DEXTROMETHORPHAN POLISTIREX 30 MG/5ML PO LQCR: 30.0000 mg | ORAL | Status: DC | PRN

## 2013-08-25 NOTE — ED Notes (Addendum)
Per patient-starting Tuesday was around sick family. Starting having sore throat on Tuesday. Reports difficulty swallowing. Denies sputum. Has only taken tylenol with no alleviation of symptoms. Tmax at home was 99.6. Denies ear pain, N, V, chills, D. C/o generalized aching and coughing. Hx lupus (flair up a month ago with generalized aches.) Ambulatory. A&Ox4. Moving all extremities equally. Speaking full, clear sentences.

## 2013-08-25 NOTE — ED Provider Notes (Signed)
CSN: [id number]     Arrival date & time 08/25/13  1913 History  This chart was scribed for non-physician provider Emilia BeckKaitlyn Donny Heffern, PA-C, working with Rolland PorterMark James, MD by Phillis HaggisGabriella Gaje, ED Scribe. This patient was seen in room WTR9/WTR9 and patient care was started at 7:45 PM.    Chief Complaint  Patient presents with  . Sore Throat   Patient is a 28 y.o. female presenting with pharyngitis. The history is provided by the patient. No language interpreter was used.  Sore Throat This is a new problem. The current episode started more than 2 days ago. The problem occurs constantly. The problem has been gradually worsening. She has tried acetaminophen and rest for the symptoms.   HPI Comments: Anita Keller is a 28 y.o. female who presents to the Emergency Department complaining of a sore throat onset three days ago. She reports associated low-grade fever and cough. She reports that she took Tylenol PM, allergy medication, and drinking warm drinks to no relief.    Past Medical History  Diagnosis Date  . Hypothyroidism   . Proteinuria 10/2008  . Lupus (systemic lupus erythematosus) 07/2009  . Embolism - blood clot    Past Surgical History  Procedure Laterality Date  . Thyroid lobectomy  Left     Benign  . Knee surgery     Family History  Problem Relation Age of Onset  . Cancer Mother   . Hypertension Sister   . Asthma Brother    History  Substance Use Topics  . Smoking status: Never Smoker   . Smokeless tobacco: Not on file  . Alcohol Use: No   OB History   Grav Para Term Preterm Abortions TAB SAB Ect Mult Living                 Review of Systems  Constitutional: Positive for fever.  HENT: Positive for sore throat.   Respiratory: Positive for cough.   All other systems reviewed and are negative.  Allergies  Aspirin; Codeine; and Sulfa antibiotics  Home Medications   Prior to Admission medications   Medication Sig Start Date End Date Taking? Authorizing Provider   acetaminophen (TYLENOL) 500 MG tablet Take 1,000 mg by mouth every 6 (six) hours as needed.    Historical Provider, MD  amLODipine (NORVASC) 5 MG tablet Take 5 mg by mouth daily.    Historical Provider, MD  cephALEXin (KEFLEX) 500 MG capsule Take 1 capsule (500 mg total) by mouth 4 (four) times daily. 12/14/12   Rolland PorterMark James, MD  doxycycline (VIBRAMYCIN) 100 MG capsule Take 1 capsule (100 mg total) by mouth 2 (two) times daily. 12/14/12   Rolland PorterMark James, MD  HYDROcodone-acetaminophen (NORCO/VICODIN) 5-325 MG per tablet Take 2 tablets by mouth every 4 (four) hours as needed. 12/14/12   Rolland PorterMark James, MD  levonorgestrel (MIRENA) 20 MCG/24HR IUD 1 each by Intrauterine route once. September 2014    Historical Provider, MD  levothyroxine (SYNTHROID, LEVOTHROID) 88 MCG tablet Take 88 mcg by mouth daily.     Historical Provider, MD  mycophenolate (CELLCEPT) 500 MG tablet Take 500 mg by mouth 2 (two) times daily.     Historical Provider, MD   BP 127/72  Pulse 100  Temp(Src) 99.1 F (37.3 C) (Oral)  Resp 21  SpO2 100%  LMP 08/18/2013 Physical Exam  Nursing note and vitals reviewed. Constitutional: She is oriented to person, place, and time. She appears well-developed and well-nourished.  HENT:  Head: Normocephalic and atraumatic.  Mouth/Throat: Oropharyngeal exudate  present.  Bilateral tonsillar edema, erythema and exudate.   Eyes: Conjunctivae and EOM are normal.  Neck: Normal range of motion. Neck supple.  Cardiovascular: Normal rate.   Pulmonary/Chest: Effort normal.  Musculoskeletal: Normal range of motion.  Neurological: She is alert and oriented to person, place, and time.  Skin: Skin is warm and dry.  Psychiatric: She has a normal mood and affect. Her behavior is normal.    ED Course  Procedures (including critical care time) DIAGNOSTIC STUDIES: Oxygen Saturation is 100% on room air, normal by my interpretation.    COORDINATION OF CARE: 7:46 PM-Discussed treatment plan which includes  cough syrup with pt at bedside and pt agreed to plan.   Labs Review Labs Reviewed  RAPID STREP SCREEN   Imaging Review No results found.   EKG Interpretation None      MDM   Final diagnoses:  Strep throat    7:52 PM Patient will be treated for strep throat based on clinical appearance. Patient will have hycodan for cough.   8:03 PM Patient prescription changed to delsym due to codiene allergy.   I personally performed the services described in this documentation, which was scribed in my presence. The recorded information has been reviewed and is accurate.     Emilia Beck, PA-C 08/25/13 1952  Emilia Beck, PA-C 08/25/13 2003

## 2013-08-25 NOTE — Discharge Instructions (Signed)
Take amoxicillin as directed until gone. Take hycodan as needed for cough. Refer to attached documents for more information.  °

## 2013-09-02 NOTE — ED Provider Notes (Signed)
Medical screening examination/treatment/procedure(s) were performed by non-physician practitioner and as supervising physician I was immediately available for consultation/collaboration.   EKG Interpretation None        Shamira Toutant, MD 09/02/13 1933 

## 2013-11-10 ENCOUNTER — Emergency Department (HOSPITAL_COMMUNITY)
Admission: EM | Admit: 2013-11-10 | Discharge: 2013-11-10 | Disposition: A | Discharge status: Home / Self Care | Payer: BC Managed Care – PPO | Attending: Emergency Medicine | Admitting: Emergency Medicine

## 2013-11-10 ENCOUNTER — Encounter (HOSPITAL_COMMUNITY): Payer: Self-pay | Admitting: Emergency Medicine

## 2013-11-10 DIAGNOSIS — M546 Pain in thoracic spine: Secondary | ICD-10-CM | POA: Diagnosis not present

## 2013-11-10 DIAGNOSIS — M545 Low back pain: Secondary | ICD-10-CM | POA: Diagnosis not present

## 2013-11-10 DIAGNOSIS — Z7952 Long term (current) use of systemic steroids: Secondary | ICD-10-CM | POA: Insufficient documentation

## 2013-11-10 DIAGNOSIS — M549 Dorsalgia, unspecified: Secondary | ICD-10-CM

## 2013-11-10 DIAGNOSIS — Z86711 Personal history of pulmonary embolism: Secondary | ICD-10-CM | POA: Diagnosis not present

## 2013-11-10 DIAGNOSIS — M62830 Muscle spasm of back: Secondary | ICD-10-CM

## 2013-11-10 DIAGNOSIS — Z79899 Other long term (current) drug therapy: Secondary | ICD-10-CM | POA: Insufficient documentation

## 2013-11-10 DIAGNOSIS — E039 Hypothyroidism, unspecified: Secondary | ICD-10-CM | POA: Diagnosis not present

## 2013-11-10 MED ORDER — ONDANSETRON 8 MG PO TBDP
8.0000 mg | ORAL_TABLET | Freq: Once | ORAL | Status: AC | PRN
  Administered 2013-11-10: 8 mg via ORAL
  Filled 2013-11-10: qty 1

## 2013-11-10 MED ORDER — HYDROCODONE-ACETAMINOPHEN 5-325 MG PO TABS
1.0000 | ORAL_TABLET | Freq: Four times a day (QID) | ORAL | Status: DC | PRN
Start: 2013-11-10 — End: 2015-11-28

## 2013-11-10 MED ORDER — DIAZEPAM 5 MG PO TABS: 5.0000 mg | ORAL_TABLET | Freq: Two times a day (BID) | ORAL | Status: DC

## 2013-11-10 MED ORDER — KETOROLAC TROMETHAMINE 60 MG/2ML IM SOLN
60.0000 mg | Freq: Once | INTRAMUSCULAR | Status: AC
Start: 2013-11-10 — End: 2013-11-10
  Administered 2013-11-10: 60 mg via INTRAMUSCULAR
  Filled 2013-11-10: qty 2

## 2013-11-10 MED ORDER — DIAZEPAM 5 MG/ML IJ SOLN
3.7500 mg | Freq: Once | INTRAMUSCULAR | Status: AC
  Administered 2013-11-10: 3.75 mg via INTRAMUSCULAR
  Filled 2013-11-10: qty 2

## 2013-11-10 MED ORDER — HYDROMORPHONE HCL 1 MG/ML IJ SOLN
0.5000 mg | Freq: Once | INTRAMUSCULAR | Status: AC
  Administered 2013-11-10: 0.5 mg via INTRAMUSCULAR
  Filled 2013-11-10: qty 1

## 2013-11-10 NOTE — Discharge Instructions (Signed)
Back Pain, Adult Low back pain is very common. About 1 in 5 people have back pain.The cause of low back pain is rarely dangerous. The pain often gets better over time.About half of people with a sudden onset of back pain feel better in just 2 weeks. About 8 in 10 people feel better by 6 weeks.  CAUSES Some common causes of back pain include:  Strain of the muscles or ligaments supporting the spine.  Wear and tear (degeneration) of the spinal discs.  Arthritis.  Direct injury to the back. DIAGNOSIS Most of the time, the direct cause of low back pain is not known.However, back pain can be treated effectively even when the exact cause of the pain is unknown.Answering your caregiver's questions about your overall health and symptoms is one of the most accurate ways to make sure the cause of your pain is not dangerous. If your caregiver needs more information, he or she may order lab work or imaging tests (X-rays or MRIs).However, even if imaging tests show changes in your back, this usually does not require surgery. HOME CARE INSTRUCTIONS For many people, back pain returns.Since low back pain is rarely dangerous, it is often a condition that people can learn to manageon their own.   Remain active. It is stressful on the back to sit or stand in one place. Do not sit, drive, or stand in one place for more than 30 minutes at a time. Take short walks on level surfaces as soon as pain allows.Try to increase the length of time you walk each day.  Do not stay in bed.Resting more than 1 or 2 days can delay your recovery.  Do not avoid exercise or work.Your body is made to move.It is not dangerous to be active, even though your back may hurt.Your back will likely heal faster if you return to being active before your pain is gone.  Pay attention to your body when you bend and lift. Many people have less discomfortwhen lifting if they bend their knees, keep the load close to their bodies,and  avoid twisting. Often, the most comfortable positions are those that put less stress on your recovering back.  Find a comfortable position to sleep. Use a firm mattress and lie on your side with your knees slightly bent. If you lie on your back, put a pillow under your knees.  Only take over-the-counter or prescription medicines as directed by your caregiver. Over-the-counter medicines to reduce pain and inflammation are often the most helpful.Your caregiver may prescribe muscle relaxant drugs.These medicines help dull your pain so you can more quickly return to your normal activities and healthy exercise.  Put ice on the injured area.  Put ice in a plastic bag.  Place a towel between your skin and the bag.  Leave the ice on for 15-20 minutes, 03-04 times a day for the first 2 to 3 days. After that, ice and heat may be alternated to reduce pain and spasms.  Ask your caregiver about trying back exercises and gentle massage. This may be of some benefit.  Avoid feeling anxious or stressed.Stress increases muscle tension and can worsen back pain.It is important to recognize when you are anxious or stressed and learn ways to manage it.Exercise is a great option. SEEK MEDICAL CARE IF:  You have pain that is not relieved with rest or medicine.  You have pain that does not improve in 1 week.  You have new symptoms.  You are generally not feeling well. SEEK   IMMEDIATE MEDICAL CARE IF:   You have pain that radiates from your back into your legs.  You develop new bowel or bladder control problems.  You have unusual weakness or numbness in your arms or legs.  You develop nausea or vomiting.  You develop abdominal pain.  You feel faint. Document Released: 01/19/2005 Document Revised: 07/21/2011 Document Reviewed: 05/23/2013 Exit[employer] Patient Information 2015 [employer]. This information is not intended to replace advice given to you by your health care provider. Make sure you  discuss any questions you have with your health care provider.  

## 2013-11-10 NOTE — ED Notes (Signed)
Pt c/o back pain since Monday after lifting a heavy duffle bag on Monday. Pain starts at trapezius and radiates down back. Describes as burning pain. Pt ambulatory to exam room with steady gait.

## 2013-11-10 NOTE — ED Notes (Signed)
Pt has a ride home.  

## 2013-11-10 NOTE — ED Provider Notes (Signed)
CSN: [id number]     Arrival date & time 11/10/13  2019 History  This chart was scribed for non-physician practitioner, Antony MaduraKelly Durenda Pechacek, PA working with Raeford RazorStephen Kohut, MD by Gwenyth Oberatherine Macek, ED scribe. This patient was seen in room WTR8/WTR8 and the patient's care was started at 9:59 PM   Chief Complaint  Patient presents with  . Back Pain   The history is provided by the patient. No language interpreter was used.   HPI Comments: Anita Roachshley Alfieri is a 28 y.o. female who presents to the Emergency Department complaining of constant, burning, "whole back" pain that is worse in her shoulders after lifting a heavy duffle bag on Monday. She denies direct trauma to her back. Pt states she tried Tylenol, Ibuprofen and rest with no improvement of symptoms. She notes that pain is worse with movement. Pt has no history of cancer, IV drug use, or surgeries within the last 3 months. Pt has a history of a blood clot due to birth control 5 years ago. She discontinued birth control and is no longer takes anti-coagulants. Pt denies incontinence, numbness, weakness, inability to ambulate, SOB, hemoptysis, and swelling in her calves, and recent travel as associated symptoms.   Past Medical History  Diagnosis Date  . Hypothyroidism   . Proteinuria 10/2008  . Lupus (systemic lupus erythematosus) 07/2009  . Embolism - blood clot    Past Surgical History  Procedure Laterality Date  . Thyroid lobectomy  Left     Benign  . Knee surgery     Family History  Problem Relation Age of Onset  . Cancer Mother   . Hypertension Sister   . Asthma Brother    History  Substance Use Topics  . Smoking status: Never Smoker   . Smokeless tobacco: Not on file  . Alcohol Use: No   OB History   Grav Para Term Preterm Abortions TAB SAB Ect Mult Living                  Review of Systems  Respiratory: Negative for cough and shortness of breath.   Musculoskeletal: Positive for back pain.  All other systems reviewed and are  negative.   Allergies  Aspirin; Codeine; and Sulfa antibiotics  Home Medications   Prior to Admission medications   Medication Sig Start Date End Date Taking? Authorizing Provider  acetaminophen (TYLENOL) 500 MG tablet Take 1,000 mg by mouth every 6 (six) hours as needed.   Yes Historical Provider, MD  hydroxychloroquine (PLAQUENIL) 200 MG tablet Take 200 mg by mouth 2 (two) times daily.   Yes Historical Provider, MD  levonorgestrel (MIRENA) 20 MCG/24HR IUD 1 each by Intrauterine route once. September 2014   Yes Historical Provider, MD  levothyroxine (SYNTHROID, LEVOTHROID) 88 MCG tablet Take 88 mcg by mouth daily.    Yes Historical Provider, MD  predniSONE (DELTASONE) 20 MG tablet Take 20 mg by mouth daily with breakfast.   Yes Historical Provider, MD  diazepam (VALIUM) 5 MG tablet Take 1 tablet (5 mg total) by mouth 2 (two) times daily. 11/10/13   Antony MaduraKelly Ellenora Talton, PA-C  HYDROcodone-acetaminophen (NORCO/VICODIN) 5-325 MG per tablet Take 1-2 tablets by mouth every 6 (six) hours as needed for moderate pain or severe pain. 11/10/13   Antony MaduraKelly Maximino Cozzolino, PA-C   BP 117/72  Pulse 66  Temp(Src) 98.2 F (36.8 C) (Oral)  Resp 16  SpO2 100%  Physical Exam  Nursing note and vitals reviewed. Constitutional: She is oriented to person, place, and time. She appears  well-developed and well-nourished. No distress.  Nontoxic/nonseptic appearing  HENT:  Head: Normocephalic and atraumatic.  Eyes: Conjunctivae and EOM are normal. No scleral icterus.  Neck: Normal range of motion.  Cardiovascular: Normal rate, regular rhythm and intact distal pulses.   DP and PT pulses 2+ bilaterally  Pulmonary/Chest: Effort normal. No respiratory distress.  Musculoskeletal: Normal range of motion. She exhibits tenderness.       Thoracic back: She exhibits tenderness. She exhibits no swelling, no deformity and no spasm.       Lumbar back: She exhibits tenderness. She exhibits no swelling, no deformity and no spasm.        Back:  Diffuse tenderness to thoracic and lumbar paraspinal muscles. No bony deformities, step-off, or crepitus. No appreciable spasm.  Neurological: She is alert and oriented to person, place, and time. She exhibits normal muscle tone. Coordination normal.  No gross sensory deficits appreciated. Patient ambulatory with normal gait. Patellar and Achilles reflexes 2+ bilaterally  Skin: Skin is warm and dry. No rash noted. She is not diaphoretic. No erythema. No pallor.  Psychiatric: She has a normal mood and affect. Her behavior is normal.    ED Course  Procedures (including critical care time) DIAGNOSTIC STUDIES: Oxygen Saturation is 100% on RA, normal by my interpretation.    COORDINATION OF CARE: 10:08 PM Discussed treatment plan with pt at bedside and pt agreed to plan.  Labs Review Labs Reviewed - No data to display  Imaging Review No results found.   EKG Interpretation None      MDM   Final diagnoses:  Spasm of back muscles  Bilateral back pain, unspecified location    28 year old female presents to the emergency department for further evaluation of back pain. That pain began after lifting a heavy duffel bag. Patient neurovascularly intact. She is ambulatory with normal gait. No red flags were signed consent for cauda equina. No history of direct trauma or injury to her back. It probably further emergent workup is indicated given the diffuse nature of symptoms without distinct bony tenderness. Symptoms managed with Toradol, Valium, and Dilaudid. Patient endorses significant improvement in symptoms with this regimen. Patient discharged with prescription for Vicodin and Valium for symptomatic management. Return precautions discussed and provided. Patient agreeable to plan with no unaddressed concerns.  I personally performed the services described in this documentation, which was scribed in my presence. The recorded information has been reviewed and is accurate.   Filed  Vitals:   11/10/13 2042 11/10/13 2310  BP: 118/82 117/72  Pulse: 66 66  Temp: 98.2 F (36.8 C)   TempSrc: Oral   Resp:  16  SpO2: 100% 100%        Antony MaduraKelly Thailan Sava, PA-C 11/11/13 0825

## 2013-11-11 NOTE — ED Provider Notes (Signed)
Medical screening examination/treatment/procedure(s) were performed by non-physician practitioner and as supervising physician I was immediately available for consultation/collaboration.   EKG Interpretation None       Siyana Erney, MD 11/11/13 1536 

## 2013-11-27 ENCOUNTER — Emergency Department (HOSPITAL_COMMUNITY)
Admission: EM | Admit: 2013-11-27 | Discharge: 2013-11-27 | Disposition: A | Discharge status: Home Health | Payer: BC Managed Care – PPO | Attending: Emergency Medicine | Admitting: Emergency Medicine

## 2013-11-27 ENCOUNTER — Encounter (HOSPITAL_COMMUNITY): Payer: Self-pay | Admitting: Emergency Medicine

## 2013-11-27 DIAGNOSIS — Z7952 Long term (current) use of systemic steroids: Secondary | ICD-10-CM | POA: Diagnosis not present

## 2013-11-27 DIAGNOSIS — M329 Systemic lupus erythematosus, unspecified: Secondary | ICD-10-CM | POA: Insufficient documentation

## 2013-11-27 DIAGNOSIS — Z79899 Other long term (current) drug therapy: Secondary | ICD-10-CM | POA: Insufficient documentation

## 2013-11-27 DIAGNOSIS — E039 Hypothyroidism, unspecified: Secondary | ICD-10-CM | POA: Insufficient documentation

## 2013-11-27 DIAGNOSIS — L0100 Impetigo, unspecified: Secondary | ICD-10-CM | POA: Diagnosis not present

## 2013-11-27 DIAGNOSIS — Z86718 Personal history of other venous thrombosis and embolism: Secondary | ICD-10-CM | POA: Diagnosis not present

## 2013-11-27 DIAGNOSIS — R21 Rash and other nonspecific skin eruption: Secondary | ICD-10-CM | POA: Diagnosis present

## 2013-11-27 MED ORDER — CEPHALEXIN 500 MG PO CAPS: 1000.0000 mg | ORAL_CAPSULE | Freq: Two times a day (BID) | ORAL | Status: DC

## 2013-11-27 MED ORDER — MUPIROCIN CALCIUM 2 % EX CREA: 1.0000 "application " | TOPICAL_CREAM | Freq: Two times a day (BID) | CUTANEOUS | Status: DC

## 2013-11-27 MED ORDER — HYDROCODONE-ACETAMINOPHEN 5-325 MG PO TABS: 1.0000 | ORAL_TABLET | Freq: Four times a day (QID) | ORAL | Status: DC | PRN

## 2013-11-27 NOTE — ED Provider Notes (Signed)
CSN: [id number]     Arrival date & time 11/27/13  1359 History  This chart was scribed for a non-physician practitioner, Carlyle Dollyhristopher W Osher Oettinger, MD , working with Raeford RazorStephen Kohut, MD by Julian HyMorgan Graham, ED Scribe. The patient was seen in WTR8/WTR8. The patient's care was started at 3:35 PM.    Chief Complaint  Patient presents with  . Rash   The history is provided by the patient. No language interpreter was used.   HPI Comments: Anita Roachshley Catanzaro is a 28 y.o. female who presents to the Emergency Department complaining of new, constant, moderate and gradually worsening rash on the face onset four days ago. Pt's rash is localized to her nose with associated color change. Pt has past medical history of lupus and denies ever having rashes with that. Pt also notes small, red dots on her palms bilaterally. Pt denies any rashes on her feet or legs. Pt states she smelled a foul odor in her nose approximately 8 hours ago. Pt currently takes Plaquenil and Synthroid. Pt denies any other symptoms at this time.  PCP: Dr. Everlene OtherBouska  Past Medical History  Diagnosis Date  . Hypothyroidism   . Proteinuria 10/2008  . Lupus (systemic lupus erythematosus) 07/2009  . Embolism - blood clot    Past Surgical History  Procedure Laterality Date  . Thyroid lobectomy  Left     Benign  . Knee surgery     Family History  Problem Relation Age of Onset  . Cancer Mother   . Hypertension Sister   . Asthma Brother    History  Substance Use Topics  . Smoking status: Never Smoker   . Smokeless tobacco: Not on file  . Alcohol Use: No   OB History   Grav Para Term Preterm Abortions TAB SAB Ect Mult Living                 Review of Systems  Constitutional: Negative for fever and chills.  Respiratory: Negative for shortness of breath.   Gastrointestinal: Negative for nausea and vomiting.  Skin: Positive for color change and rash.  Neurological: Negative for weakness.      Allergies  Aspirin; Codeine; and Sulfa  antibiotics  Home Medications   Prior to Admission medications   Medication Sig Start Date End Date Taking? Authorizing Provider  acetaminophen (TYLENOL) 500 MG tablet Take 1,000 mg by mouth every 6 (six) hours as needed.    Historical Provider, MD  diazepam (VALIUM) 5 MG tablet Take 1 tablet (5 mg total) by mouth 2 (two) times daily. 11/10/13   Antony MaduraKelly Humes, PA-C  HYDROcodone-acetaminophen (NORCO/VICODIN) 5-325 MG per tablet Take 1-2 tablets by mouth every 6 (six) hours as needed for moderate pain or severe pain. 11/10/13   Antony MaduraKelly Humes, PA-C  hydroxychloroquine (PLAQUENIL) 200 MG tablet Take 200 mg by mouth 2 (two) times daily.    Historical Provider, MD  levonorgestrel (MIRENA) 20 MCG/24HR IUD 1 each by Intrauterine route once. September 2014    Historical Provider, MD  levothyroxine (SYNTHROID, LEVOTHROID) 88 MCG tablet Take 88 mcg by mouth daily.     Historical Provider, MD  predniSONE (DELTASONE) 20 MG tablet Take 20 mg by mouth daily with breakfast.    Historical Provider, MD   Triage Vitals: BP 117/74  Pulse 86  Temp(Src) 98.1 F (36.7 C) (Oral)  Resp 18  SpO2 100%  Physical Exam  Nursing note and vitals reviewed. Constitutional: She is oriented to person, place, and time. She appears well-developed and well-nourished. No  distress.  HENT:  Head: Normocephalic and atraumatic.  Eyes: Conjunctivae and EOM are normal.  Neck: Neck supple. No tracheal deviation present.  Cardiovascular: Normal rate.   Pulmonary/Chest: Effort normal. No respiratory distress.  Musculoskeletal: Normal range of motion.  Neurological: She is alert and oriented to person, place, and time.  Skin: Skin is warm and dry.  Psychiatric: She has a normal mood and affect. Her behavior is normal.    ED Course  Procedures (including critical care time) DIAGNOSTIC STUDIES: Oxygen Saturation is 100% on RA, normal by my interpretation.    COORDINATION OF CARE: 3:45 PM- Patient informed of current plan for  treatment and evaluation and agrees with plan at this time.    Patient will be treated for a possible infectious source for her rash. The area appears to have impetigo like qualities. She is advised to follow up with her PCP.    Carlyle DollyChristopher W Natalin Bible, PA-C 12/02/13 [phone]  Raeford RazorStephen Kohut, MD 12/07/13 [phone]

## 2013-11-27 NOTE — Discharge Instructions (Signed)
Return here as needed.  Follow-up with your primary care doctor as soon as possible.  Keep the area clean and dry

## 2013-11-27 NOTE — ED Notes (Signed)
Pt c/o rash to face and head since Thursday.  States that she thinks she is having a reaction to meds (plaquenil or muscle relaxer).  Had her make up professionally done on 10/17.  Also thinks it may be a lupus rash because pt has lupus.

## 2015-11-28 ENCOUNTER — Ambulatory Visit (INDEPENDENT_AMBULATORY_CARE_PROVIDER_SITE_OTHER): Payer: BLUE CROSS/BLUE SHIELD | Admitting: Neurology

## 2015-11-28 ENCOUNTER — Encounter: Payer: Self-pay | Admitting: Neurology

## 2015-11-28 VITALS — BP 124/76 | HR 102 | Ht 64.0 in | Wt 182.4 lb

## 2015-11-28 DIAGNOSIS — I677 Cerebral arteritis, not elsewhere classified: Secondary | ICD-10-CM

## 2015-11-28 DIAGNOSIS — G4440 Drug-induced headache, not elsewhere classified, not intractable: Secondary | ICD-10-CM

## 2015-11-28 DIAGNOSIS — G43711 Chronic migraine without aura, intractable, with status migrainosus: Secondary | ICD-10-CM

## 2015-11-28 DIAGNOSIS — R42 Dizziness and giddiness: Secondary | ICD-10-CM

## 2015-11-28 DIAGNOSIS — H539 Unspecified visual disturbance: Secondary | ICD-10-CM

## 2015-11-28 DIAGNOSIS — H93A3 Pulsatile tinnitus, bilateral: Secondary | ICD-10-CM

## 2015-11-28 DIAGNOSIS — G8929 Other chronic pain: Secondary | ICD-10-CM

## 2015-11-28 DIAGNOSIS — R51 Headache: Secondary | ICD-10-CM

## 2015-11-28 DIAGNOSIS — R519 Headache, unspecified: Secondary | ICD-10-CM

## 2015-11-28 DIAGNOSIS — M329 Systemic lupus erythematosus, unspecified: Secondary | ICD-10-CM

## 2015-11-28 MED ORDER — METHYLPREDNISOLONE 4 MG PO TBPK: ORAL_TABLET | ORAL | 1 refills | Status: DC

## 2015-11-28 MED ORDER — PROCHLORPERAZINE MALEATE 10 MG PO TABS: 10.0000 mg | ORAL_TABLET | Freq: Three times a day (TID) | ORAL | 1 refills | Status: DC

## 2015-11-28 NOTE — Patient Instructions (Addendum)
Overall you are doing fairly well but I do want to suggest a few things today:   Remember to drink plenty of fluid, eat healthy meals and do not skip any meals. Try to eat protein with a every meal and eat a healthy snack such as fruit or nuts in between meals. Try to keep a regular sleep-wake schedule and try to exercise daily, particularly in the form of walking, 20-30 minutes a day, if you can.   As far as your medications are concerned, I would like to suggest Compazine 10mg  three times a day Medrol dosepak   As far as diagnostic testing: MRI of the brain and MRA of the head  I would like to see you back in 8 weeks, sooner if we need to. Please call us with any interim questions, concerns, problems, updates or refill requests.   Our phone number is [phone]. We also have an after hours call service for urgent matters and there is a physician on-call for urgent questions. For any emergencies you know to call 911 or go to the nearest emergency room  Prochlorperazine tablets What is this medicine? PROCHLORPERAZINE (proe klor PER a zeen) helps to control severe nausea and vomiting. This medicine is also used to treat schizophrenia. It can also help patients who experience anxiety that is not due to psychological illness. This medicine may be used for other purposes; ask your health care provider or pharmacist if you have questions. What should I tell my health care provider before I take this medicine? They need to know if you have any of these conditions: -blood disorders or disease -dementia -liver disease or jaundice -Parkinson's disease -uncontrollable movement disorder -an unusual or allergic reaction to prochlorperazine, other medicines, foods, dyes, or preservatives -pregnant or trying to get pregnant -breast-feeding How should I use this medicine? Take this medicine by mouth with a glass of water. Follow the directions on the prescription label. Take your doses at regular  intervals. Do not take your medicine more often than directed. Do not stop taking this medicine suddenly. This can cause nausea, vomiting, and dizziness. Ask your doctor or health care professional for advice. Talk to your pediatrician regarding the use of this medicine in children. Special care may be needed. While this drug may be prescribed for children as young as 2 years for selected conditions, precautions do apply. Overdosage: If you think you have taken too much of this medicine contact a poison control center or emergency room at once. NOTE: This medicine is only for you. Do not share this medicine with others. What if I miss a dose? If you miss a dose, take it as soon as you can. If it is almost time for your next dose, take only that dose. Do not take double or extra doses. What may interact with this medicine? Do not take this medicine with any of the following medications: -amoxapine -antidepressants like citalopram, escitalopram, fluoxetine, paroxetine, and sertraline -deferoxamine -dofetilide -maprotiline -tricyclic antidepressants like amitriptyline, clomipramine, imipramine, nortiptyline and others This medicine may also interact with the following medications: -lithium -medicines for pain -phenytoin -propranolol -warfarin This list may not describe all possible interactions. Give your health care provider a list of all the medicines, herbs, non-prescription drugs, or dietary supplements you use. Also tell them if you smoke, drink alcohol, or use illegal drugs. Some items may interact with your medicine. What should I watch for while using this medicine? Visit your doctor or health care professional for regular checks on  your progress. You may get drowsy or dizzy. Do not drive, use machinery, or do anything that needs mental alertness until you know how this medicine affects you. Do not stand or sit up quickly, especially if you are an older patient. This reduces the risk of  dizzy or fainting spells. Alcohol may interfere with the effect of this medicine. Avoid alcoholic drinks. This medicine can reduce the response of your body to heat or cold. Dress warm in cold weather and stay hydrated in hot weather. If possible, avoid extreme temperatures like saunas, hot tubs, very hot or cold showers, or activities that can cause dehydration such as vigorous exercise. This medicine can make you more sensitive to the sun. Keep out of the sun. If you cannot avoid being in the sun, wear protective clothing and use sunscreen. Do not use sun lamps or tanning beds/booths. Your mouth may get dry. Chewing sugarless gum or sucking hard candy, and drinking plenty of water may help. Contact your doctor if the problem does not go away or is severe. What side effects may I notice from receiving this medicine? Side effects that you should report to your doctor or health care professional as soon as possible: -blurred vision -breast enlargement in men or women -breast milk in women who are not breast-feeding -chest pain, fast or irregular heartbeat -confusion, restlessness -dark yellow or brown urine -difficulty breathing or swallowing -dizziness or fainting spells -drooling, shaking, movement difficulty (shuffling walk) or rigidity -fever, chills, sore throat -involuntary or uncontrollable movements of the eyes, mouth, head, arms, and legs -seizures -stomach area pain -unusually weak or tired -unusual bleeding or bruising -yellowing of skin or eyes Side effects that usually do not require medical attention (report to your doctor or health care professional if they continue or are bothersome): -difficulty passing urine -difficulty sleeping -headache -sexual dysfunction -skin rash, or itching This list may not describe all possible side effects. Call your doctor for medical advice about side effects. You may report side effects to FDA at 1-800-FDA-1088. Where should I keep my  medicine? Keep out of the reach of children. Store at room temperature between 15 and 30 degrees C (59 and 86 degrees F). Protect from light. Throw away any unused medicine after the expiration date. NOTE: This sheet is a summary. It may not cover all possible information. If you have questions about this medicine, talk to your doctor, pharmacist, or health care provider.    2016, Elsevier/Gold Standard. (2011-06-09 16:59:39)   Methylprednisolone tablets What is this medicine? METHYLPREDNISOLONE (meth ill pred NISS oh lone) is a corticosteroid. It is commonly used to treat inflammation of the skin, joints, lungs, and other organs. Common conditions treated include asthma, allergies, and arthritis. It is also used for other conditions, such as blood disorders and diseases of the adrenal glands. This medicine may be used for other purposes; ask your health care provider or pharmacist if you have questions. What should I tell my health care provider before I take this medicine? They need to know if you have any of these conditions: -Cushing's syndrome -diabetes -glaucoma -heart problems or disease -high blood pressure -infection such as herpes, measles, tuberculosis, or chickenpox -kidney disease -liver disease -mental problems -myasthenia gravis -osteoporosis -seizures -stomach ulcer or intestine disease including colitis and diverticulitis -thyroid problem -an unusual or allergic reaction to lactose, methylprednisolone, other medicines, foods, dyes, or preservatives -pregnant or trying to get pregnant -breast-feeding How should I use this medicine? Take this medicine by mouth with  a drink of water. Follow the directions on the prescription label. Take it with food or milk to avoid stomach upset. If you are taking this medicine once a day, take it in the morning. Do not take more medicine than you are told to take. Do not suddenly stop taking your medicine because you may develop a  severe reaction. Your doctor will tell you how much medicine to take. If your doctor wants you to stop the medicine, the dose may be slowly lowered over time to avoid any side effects. Talk to your pediatrician regarding the use of this medicine in children. Special care may be needed. Overdosage: If you think you have taken too much of this medicine contact a poison control center or emergency room at once. NOTE: This medicine is only for you. Do not share this medicine with others. What if I miss a dose? If you miss a dose, take it as soon as you can. If it is almost time for your next dose, talk to your doctor or health care professional. You may need to miss a dose or take an extra dose. Do not take double or extra doses without advice. What may interact with this medicine? Do not take this medicine with any of the following medications: -mifepristone This medicine may also interact with the following medications: -tacrolimus -vaccines -warfarin This list may not describe all possible interactions. Give your health care provider a list of all the medicines, herbs, non-prescription drugs, or dietary supplements you use. Also tell them if you smoke, drink alcohol, or use illegal drugs. Some items may interact with your medicine. What should I watch for while using this medicine? Visit your doctor or health care professional for regular checks on your progress. If you are taking this medicine for a long time, carry an identification card with your name and address, the type and dose of your medicine, and your doctor's name and address. The medicine may increase your risk of getting an infection. Stay away from people who are sick. Tell your doctor or health care professional if you are around anyone with measles or chickenpox. If you are going to have surgery, tell your doctor or health care professional that you have taken this medicine within the last twelve months. Ask your doctor or health  care professional about your diet. You may need to lower the amount of salt you eat. The medicine can increase your blood sugar. If you are a diabetic check with your doctor if you need help adjusting the dose of your diabetic medicine. What side effects may I notice from receiving this medicine? Side effects that you should report to your doctor or health care professional as soon as possible: -allergic reactions like skin rash, itching or hives, swelling of the face, lips, or tongue -eye pain, decreased or blurred vision, or bulging eyes -fever, sore throat, sneezing, cough, or other signs of infection, wounds that will not heal -increased thirst -mental depression, mood swings, mistaken feelings of self importance or of being mistreated -pain in hips, back, ribs, arms, shoulders, or legs -swelling of the ankles, feet, hands -trouble passing urine or change in the amount of urine Side effects that usually do not require medical attention (report to your doctor or health care professional if they continue or are bothersome): -confusion, excitement, restlessness -headache -nausea, vomiting -skin problems, acne, thin and shiny skin -weight gain This list may not describe all possible side effects. Call your doctor for medical advice about side  effects. You may report side effects to FDA at 1-800-FDA-1088. Where should I keep my medicine? Keep out of the reach of children. Store at room temperature between 20 and 25 degrees C (68 and 77 degrees F). Throw away any unused medicine after the expiration date. NOTE: This sheet is a summary. It may not cover all possible information. If you have questions about this medicine, talk to your doctor, pharmacist, or health care provider.    2016, Elsevier/Gold Standard. (2011-10-20 11:38:34)

## 2015-11-28 NOTE — Progress Notes (Signed)
[facility]    Provider:  Dr Lucia GaskinsAhern Referring Provider: Tracey HarriesBouska, David, MD Primary Care Physician:  Aura DialsBOUSKA,DAVID E, MD  CC:  headaches  HPI:  Anita Keller is a 30 y.o. female here as a referral from Dr. Everlene OtherBouska for headaches. 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 Patient's mother is here also provides much information She is on CellCept, Synthroid, plaquenil, prednisone. The headaches started 2 years ago, progressively getting worse. No inciting events, no new medications at that time, no head trauma. Started after her first lupus rash. Feels like someone is pounding at her brain, behind the eyes like eye is going to pop out, on the back of the head always bilateral. Every day, she wakes up with them, it will ease up other times constant, daily. Worse during the day at work. She does wake with them and also goes to sleep with some continuous. She does not snore. Mother, grandmother and great grandmother had migraines. Mother said she had severe migraines in her 1520s. Patient can have light sensitivity, has to go into dark room, cover eyes, needs to lay still. No sound sensitivity, smells may trigger headaches. Can last all day. Stress at work makes it worse. She works at a call center. Head pounds. Can be 10/10. She gets nausea but no vomiting. Not positional. She gets blurry vision with the headaches and dizziness. She also feels generally weak. She has to stay in the bed all day long when she has a bad headache. No hearing changes. She takes tylenol, alleve pm or motrin PM every 4 hours every day  Reviewed notes, labs and imaging from outside physicians, which showed:  Patient has recurrent daily headaches. She uses over-the-counter headache medicines. Headaches are not consistent with migraines and they're generalized and occur several times  per day. She does not have photophobia, nausea or vomiting with the headaches and continues to work through the headaches. Her rheumatologist [facility], nephrologist is Dr. Briant CedarMattingly, current diet includes frequent fast foods, she is concerned about weight and is interested in weight loss  [facility]ractable headaches in August 2016. Physician suspected it was because she was not sleeping, he/she noted headaches have been occurring over a year primarily in the morning when she is sleeping only 4 hours per night  Cholesterol 388 October 2010, triglycerides 201 October 2010, BUN 12 and creatinine 1.0 07/05/2014.  Review of Systems: Patient complains of symptoms per HPI as well as the following symptoms: Fatigue, joint pain, joint swelling, allergies, headache, not enough sleep, decreased energy, insomnia, sleepiness. Pertinent negatives per HPI. All others negative.   Social History   Social History  . Marital status: Single    Spouse name: N/A  . Number of children: N/A  . Years of education: N/A   Occupational History  . Not on file.   Social History Main Topics  . Smoking status: Never Smoker  . Smokeless tobacco: Never Used  . Alcohol use No  . Drug use: No  . Sexual activity: Yes   Other Topics Concern  . Not on file   Social History Narrative   Lives with mother, maternal uncle and aunt   Caffeine use: none    Family History  Problem Relation Age of Onset  . Cancer Mother   . Hypertension Sister   . Asthma  Brother     Past Medical History:  Diagnosis Date  . Embolism - blood clot   . Hypothyroidism   . Lupus (systemic lupus erythematosus) (HCC) 07/2009  . Proteinuria 10/2008    Past Surgical History:  Procedure Laterality Date  . KNEE SURGERY    . THYROID LOBECTOMY  Left    Benign    Current Outpatient Prescriptions  Medication Sig Dispense Refill  . acetaminophen (TYLENOL) 500  MG tablet Take 1,000 mg by mouth every 6 (six) hours as needed.    . hydroxychloroquine (PLAQUENIL) 200 MG tablet Take 200 mg by mouth 2 (two) times daily.    Marland Kitchen levonorgestrel (MIRENA) 20 MCG/24HR IUD 1 each by Intrauterine route once. September 2014    . levothyroxine (SYNTHROID, LEVOTHROID) 88 MCG tablet Take 88 mcg by mouth daily.     . mycophenolate (CELLCEPT) 500 MG tablet Take 500 mg by mouth 2 (two) times daily.    . methylPREDNISolone (MEDROL DOSEPAK) 4 MG TBPK tablet follow package directions 21 tablet 1  . prochlorperazine (COMPAZINE) 10 MG tablet Take 1 tablet (10 mg total) by mouth 3 (three) times daily. For 7 days then every 12 hours as needed 30 tablet 1   No current facility-administered medications for this visit.     Allergies as of 11/28/2015 - Review Complete 11/28/2015  Allergen Reaction Noted  . Aspirin Rash 12/18/2010  . Codeine Rash 12/18/2010  . Sulfa antibiotics Rash 12/18/2010    Vitals: BP 124/76 (BP Location: Right Arm, Patient Position: Sitting, Cuff Size: Normal)   Pulse (!) 102   Ht 5\' 4"  (1.626 m)   Wt 182 lb 6.4 oz (82.7 kg)   BMI 31.31 kg/m  Last Weight:  Wt Readings from Last 1 Encounters:  11/28/15 182 lb 6.4 oz (82.7 kg)   Last Height:   Ht Readings from Last 1 Encounters:  11/28/15 5\' 4"  (1.626 m)     Physical exam: Exam: Gen: NAD, conversant, well nourised, obese, well groomed                     CV: RRR, no MRG. No Carotid Bruits. No peripheral edema, warm, nontender Eyes: Conjunctivae clear without exudates or hemorrhage  Neuro: Detailed Neurologic Exam  Speech:    Speech is normal; fluent and spontaneous with normal comprehension.  Cognition:    The patient is oriented to person, place, and time;     recent and remote memory intact;     language fluent;     normal attention, concentration,     fund of knowledge Cranial Nerves:    The pupils are equal, round, and reactive to light. The fundi are normal and spontaneous  venous pulsations are present. Visual fields are full to finger confrontation. Extraocular movements are intact. Trigeminal sensation is intact and the muscles of mastication are normal. The face is symmetric. The palate elevates in the midline. Hearing intact. Voice is normal. Shoulder shrug is normal. The tongue has normal motion without fasciculations.   Coordination:    Normal finger to nose and heel to shin. Normal rapid alternating movements.   Gait:    Heel-toe and tandem gait are normal.   Motor Observation:    No asymmetry, no atrophy, and no involuntary movements noted. Tone:    Normal muscle tone.    Posture:    Posture is normal. normal erect    Strength:    Strength is V/V in the upper and lower limbs.  Sensation: intact to LT     Reflex Exam:  DTR's:    Deep tendon reflexes in the upper and lower extremities are normal bilaterally.   Toes:    The toes are downgoing bilaterally.   Clonus:    Clonus is absent.     Assessment/Plan:  Patient with Lupus with 2 years progressive headache intractable.  Need MRI of the brain and MRA of the blood vessels to evaluate for intracranial causes of chronic, persistent, intractable headaches especially given the patient has Lupus and is on immunosuppressants; need to rule out tumors, lesions, infections as well as vasculitic process. Discussed the patient has a history of kidney disease and lupus, contrast can worsen kidney disease however her last creatinine is normal. Patient and mother state that she has had MRI contrast in the past and there is no contraindication she's been told she can take this. Advised her to follow-up with her nephrologist and make sure she can have MRI contrast before having the study.  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 They only thing to do is to stop the medication unfortunately. In the timeframe after stopping at her headaches may get much worse. I will give her some medication to try to bridge that. She should significantly  improve with her chronic daily headaches after 2-4 weeks of being off her daily over-the-counter medications. Do not use these medications more than 2 times in a week. Discussed very short-term steroids, there is a risk for vascular necrosis patient understands risks and acknowledges however such a short course is unlikely to cause/worsen any AVN.   Migraines: Headaches are migrainous. Discussed migraine management including acute management and preventative management. She does have a significant history of migraines in the family. We'll see her back in 8 weeks and see where her headache frequency is after she stops her medication overuse Tylenol and ibuprofen and Benadryl.  As far as your medications are concerned, I would like to suggest: - Stop daily over the counter med use (Tylenol, excedrin, ibuprofen, alleve) Do not take these  more than 2-3x in one week - For the next 6 days take steroids (medrol) and compazine try and bridge her while she comes off her daily OTC medications. Side effects discussed as per patient AVS. - Given morning headaches, may consider sleep evaluation in the future if headaches persist despite above management.  Discussed; To prevent or relieve headaches, try the following:  Cool Compress. Lie down and place a cool compress on your head.   Avoid headache triggers. If certain foods or odors seem to have triggered your migraines in the past, avoid them. A headache diary might help you identify triggers.   Include physical activity in your daily routine. Try a daily walk or other moderate aerobic exercise.   Manage stress. Find healthy ways to cope with the stressors, such as delegating tasks on your to-do  list.   Practice relaxation techniques. Try deep breathing, yoga, massage and visualization.   Eat regularly. Eating regularly scheduled meals and maintaining a healthy diet might help prevent headaches. Also, drink plenty of fluids.   Follow a regular sleep schedule. Sleep deprivation might contribute to headaches  Consider biofeedback. With this mind-body technique, you learn to control certain bodily functions - such as  muscle tension, heart rate and blood pressure - to prevent headaches or reduce headache pain.    Proceed to emergency room if you experience new or worsening symptoms or symptoms do not resolve, if you have new neurologic symptoms or if headache is severe, or for any concerning symptom.   Cc: Dr. Everlene Other, Dr. Anson Oregon with [facility], nephrologist is Dr. Briant Cedar, Naomie Dean, MD  [facility] [address] [place], [place] [address]  Phone [phone] Fax [phone]

## 2016-02-11 ENCOUNTER — Ambulatory Visit: Payer: BLUE CROSS/BLUE SHIELD | Admitting: Neurology

## 2016-03-03 ENCOUNTER — Emergency Department (HOSPITAL_COMMUNITY)
Admission: EM | Admit: 2016-03-03 | Discharge: 2016-03-03 | Disposition: A | Discharge status: Home / Self Care | Payer: Self-pay | Attending: Emergency Medicine | Admitting: Emergency Medicine

## 2016-03-03 ENCOUNTER — Encounter (HOSPITAL_COMMUNITY): Payer: Self-pay | Admitting: Emergency Medicine

## 2016-03-03 ENCOUNTER — Emergency Department (HOSPITAL_COMMUNITY): Payer: Self-pay

## 2016-03-03 DIAGNOSIS — K529 Noninfective gastroenteritis and colitis, unspecified: Secondary | ICD-10-CM | POA: Insufficient documentation

## 2016-03-03 DIAGNOSIS — R1084 Generalized abdominal pain: Secondary | ICD-10-CM

## 2016-03-03 DIAGNOSIS — E039 Hypothyroidism, unspecified: Secondary | ICD-10-CM | POA: Insufficient documentation

## 2016-03-03 DIAGNOSIS — R197 Diarrhea, unspecified: Secondary | ICD-10-CM

## 2016-03-03 DIAGNOSIS — Z79899 Other long term (current) drug therapy: Secondary | ICD-10-CM | POA: Insufficient documentation

## 2016-03-03 DIAGNOSIS — R112 Nausea with vomiting, unspecified: Secondary | ICD-10-CM | POA: Insufficient documentation

## 2016-03-03 LAB — COMPREHENSIVE METABOLIC PANEL
ALBUMIN: 3.9 g/dL (ref 3.5–5.0)
ALT: 20 U/L (ref 14–54)
ANION GAP: 7 (ref 5–15)
AST: 24 U/L (ref 15–41)
Alkaline Phosphatase: 71 U/L (ref 38–126)
BILIRUBIN TOTAL: 0.7 mg/dL (ref 0.3–1.2)
BUN: 12 mg/dL (ref 6–20)
CHLORIDE: 106 mmol/L (ref 101–111)
CO2: 24 mmol/L (ref 22–32)
Calcium: 8.7 mg/dL — ABNORMAL LOW (ref 8.9–10.3)
Creatinine, Ser: 0.9 mg/dL (ref 0.44–1.00)
GFR calc Af Amer: >60 mL/min (ref >60)
GFR calc non Af Amer: >60 mL/min (ref >60)
GLUCOSE: 116 mg/dL — AB (ref 65–99)
POTASSIUM: 3.9 mmol/L (ref 3.5–5.1)
Sodium: 137 mmol/L (ref 135–145)
TOTAL PROTEIN: 7.5 g/dL (ref 6.5–8.1)

## 2016-03-03 LAB — GASTROINTESTINAL PANEL BY PCR, STOOL (REPLACES STOOL CULTURE)

## 2016-03-03 LAB — URINALYSIS, ROUTINE W REFLEX MICROSCOPIC
BILIRUBIN URINE: NEGATIVE
Glucose, UA: NEGATIVE mg/dL
Hgb urine dipstick: NEGATIVE
Ketones, ur: NEGATIVE mg/dL
LEUKOCYTES UA: NEGATIVE
NITRITE: NEGATIVE
PH: 6 (ref 5.0–8.0)
Protein, ur: NEGATIVE mg/dL
SPECIFIC GRAVITY, URINE: 1.016 (ref 1.005–1.030)

## 2016-03-03 LAB — I-STAT BETA HCG BLOOD, ED (MC, WL, AP ONLY): I-stat hCG, quantitative: <5 m[IU]/mL (ref <5)

## 2016-03-03 LAB — CBC
HEMATOCRIT: 36.2 % (ref 36.0–46.0)
HEMOGLOBIN: 12.3 g/dL (ref 12.0–15.0)
MCH: 29 pg (ref 26.0–34.0)
MCHC: 34 g/dL (ref 30.0–36.0)
MCV: 85.4 fL (ref 78.0–100.0)
Platelets: 194 10*3/uL (ref 150–400)
RBC: 4.24 MIL/uL (ref 3.87–5.11)
RDW: 11.8 % (ref 11.5–15.5)
WBC: 5.8 10*3/uL (ref 4.0–10.5)

## 2016-03-03 LAB — PREGNANCY, URINE: PREG TEST UR: NEGATIVE

## 2016-03-03 LAB — LIPASE, BLOOD: LIPASE: 22 U/L (ref 11–51)

## 2016-03-03 LAB — OCCULT BLOOD X 1 CARD TO LAB, STOOL: FECAL OCCULT BLD: POSITIVE — AB

## 2016-03-03 MED ORDER — ONDANSETRON HCL 4 MG/2ML IJ SOLN
4.0000 mg | Freq: Once | INTRAMUSCULAR | Status: AC
  Administered 2016-03-03: 4 mg via INTRAVENOUS
  Filled 2016-03-03: qty 2

## 2016-03-03 MED ORDER — HYDROCODONE-ACETAMINOPHEN 5-325 MG PO TABS: 2.0000 | ORAL_TABLET | ORAL | 0 refills | Status: AC | PRN

## 2016-03-03 MED ORDER — CIPROFLOXACIN HCL 500 MG PO TABS
500.0000 mg | ORAL_TABLET | Freq: Once | ORAL | Status: AC
  Administered 2016-03-03: 500 mg via ORAL
  Filled 2016-03-03: qty 1

## 2016-03-03 MED ORDER — CIPROFLOXACIN HCL 500 MG PO TABS: 500.0000 mg | ORAL_TABLET | Freq: Two times a day (BID) | ORAL | 0 refills | Status: DC

## 2016-03-03 MED ORDER — SODIUM CHLORIDE 0.9 % IV BOLUS (SEPSIS)
1000.0000 mL | Freq: Once | INTRAVENOUS | Status: AC
  Administered 2016-03-03: 1000 mL via INTRAVENOUS

## 2016-03-03 MED ORDER — IOPAMIDOL (ISOVUE-300) INJECTION 61%
INTRAVENOUS | Status: AC
  Administered 2016-03-03: 100 mL via INTRAVENOUS
  Filled 2016-03-03: qty 100

## 2016-03-03 MED ORDER — METRONIDAZOLE 500 MG PO TABS: 500.0000 mg | ORAL_TABLET | Freq: Two times a day (BID) | ORAL | 0 refills | Status: DC

## 2016-03-03 MED ORDER — HYDROMORPHONE HCL 1 MG/ML IJ SOLN
1.0000 mg | Freq: Once | INTRAMUSCULAR | Status: AC
  Administered 2016-03-03: 1 mg via INTRAVENOUS
  Filled 2016-03-03: qty 1

## 2016-03-03 MED ORDER — ONDANSETRON 4 MG PO TBDP: 4.0000 mg | ORAL_TABLET | ORAL | 0 refills | Status: AC | PRN

## 2016-03-03 MED ORDER — IOPAMIDOL (ISOVUE-300) INJECTION 61%
100.0000 mL | Freq: Once | INTRAVENOUS | Status: AC | PRN
  Administered 2016-03-03: 100 mL via INTRAVENOUS

## 2016-03-03 MED ORDER — METRONIDAZOLE 500 MG PO TABS
500.0000 mg | ORAL_TABLET | Freq: Once | ORAL | Status: AC
  Administered 2016-03-03: 500 mg via ORAL
  Filled 2016-03-03: qty 1

## 2016-03-03 NOTE — ED Provider Notes (Signed)
Patient accepted signout from Dr. Blinda LeatherwoodPollina. CT does show diffuse colitis. Patient has had bloody diarrhea with abdominal pain. I will initiate Cipro and Flagyl. Patient is given Vicodin and Zofran for control symptoms. Vital signs remain stable. Patient has not had vomiting. She is counseled on signs and symptoms for which return.   Arby BarretteMarcy Roniyah Llorens, MD 03/03/16 [phone]

## 2016-03-03 NOTE — ED Triage Notes (Signed)
Pt reports new onset of abd pain that stared at 0000 and began to worsen at 0300. Pt reports several episodes of vomiting and diarrhea. Also stated on last episode of diarrhea bright red blood in stool.

## 2016-03-03 NOTE — ED Notes (Signed)
Stool card sent for a lab collect  Urine poc changed to urine lab  Stool sample sent for panel and card collect

## 2016-03-03 NOTE — ED Provider Notes (Signed)
[facility]-EMERGENCY DEPT Provider Note   CSN: [id number] Arrival date & time: 03/03/16  0433     History   Chief Complaint Chief Complaint  Patient presents with  . Abdominal Pain    HPI Anita Keller is a 31 y.o. female.  Patient presents to the ER for evaluation of abdominal pain that began at midnight. Pain progressively worsened and then she began to have vomiting and diarrhea. She had several episodes of watery diarrhea, but diarrhea has now turned to red blood. Pain is crampy and diffuse in the abdomen. No fever. No history of colitis or diverticulitis. No family history of IBD.      Past Medical History:  Diagnosis Date  . Embolism - blood clot   . Hypothyroidism   . Lupus (systemic lupus erythematosus) (HCC) 07/2009  . Proteinuria 10/2008    Patient Active Problem List   Diagnosis Date Noted  . Chronic daily headache 11/28/2015  . SLE (systemic lupus erythematosus) (HCC) 11/28/2015    Past Surgical History:  Procedure Laterality Date  . KNEE SURGERY    . THYROID LOBECTOMY  Left    Benign    OB History    No data available       Home Medications    Prior to Admission medications   Medication Sig Start Date End Date Taking? Authorizing Provider  acetaminophen (TYLENOL) 500 MG tablet Take 1,000 mg by mouth every 6 (six) hours as needed.    Historical Provider, MD  hydroxychloroquine (PLAQUENIL) 200 MG tablet Take 200 mg by mouth 2 (two) times daily.    Historical Provider, MD  levonorgestrel (MIRENA) 20 MCG/24HR IUD 1 each by Intrauterine route once. September 2014    Historical Provider, MD  levothyroxine (SYNTHROID, LEVOTHROID) 88 MCG tablet Take 88 mcg by mouth daily.     Historical Provider, MD  methylPREDNISolone (MEDROL DOSEPAK) 4 MG TBPK tablet follow package directions 11/28/15   Anson Fret, MD  mycophenolate (CELLCEPT) 500 MG tablet Take 500 mg by mouth 2 (two) times daily.    Historical Provider, MD  prochlorperazine (COMPAZINE) 10 MG tablet  Take 1 tablet (10 mg total) by mouth 3 (three) times daily. For 7 days then every 12 hours as needed 11/28/15   Anson Fret, MD    Family History Family History  Problem Relation Age of Onset  . Cancer Mother   . Hypertension Sister   . Asthma Brother     Social History Social History  Substance Use Topics  . Smoking status: Never Smoker  . Smokeless tobacco: Never Used  . Alcohol use No     Allergies   Aspirin; Codeine; and Sulfa antibiotics   Review of Systems Review of Systems  Gastrointestinal: Positive for abdominal pain, blood in stool, diarrhea and vomiting.  All other systems reviewed and are negative.    Physical Exam Updated Vital Signs BP 133/89 (BP Location: Right Arm)   Pulse 85   Temp 97.7 F (36.5 C) (Oral)   Resp 20   Ht 5\' 4"  (1.626 m)   Wt 177 lb (80.3 kg)   LMP 02/15/2016 Comment: negative beta HCG 03/03/16  SpO2 100%   BMI 30.38 kg/m   Physical Exam  Constitutional: She is oriented to person, place, and time. She appears well-developed and well-nourished. No distress.  HENT:  Head: Normocephalic and atraumatic.  Right Ear: Hearing normal.  Left Ear: Hearing normal.  Nose: Nose normal.  Mouth/Throat: Oropharynx is clear and moist and mucous membranes are normal.  Eyes: Conjunctivae and EOM are normal. Pupils are equal, round, and reactive to light.  Neck: Normal range of motion. Neck supple.  Cardiovascular: Regular rhythm, S1 normal and S2 normal.  Exam reveals no gallop and no friction rub.   No murmur heard. Pulmonary/Chest: Effort normal and breath sounds normal. No respiratory distress. She exhibits no tenderness.  Abdominal: Soft. Normal appearance and bowel sounds are normal. There is no hepatosplenomegaly. There is generalized tenderness. There is no rebound, no guarding, no tenderness at McBurney's point and negative Murphy's sign. No hernia.  Musculoskeletal: Normal range of motion.  Neurological: She is alert and oriented to  person, place, and time. She has normal strength. No cranial nerve deficit or sensory deficit. Coordination normal. GCS eye subscore is 4. GCS verbal subscore is 5. GCS motor subscore is 6.  Skin: Skin is warm, dry and intact. No rash noted. No cyanosis.  Psychiatric: She has a normal mood and affect. Her speech is normal and behavior is normal. Thought content normal.  Nursing note and vitals reviewed.    ED Treatments / Results  Labs (all labs ordered are listed, but only abnormal results are displayed) Labs Reviewed  COMPREHENSIVE METABOLIC PANEL - Abnormal; Notable for the following:       Result Value   Glucose, Bld 116 (*)    Calcium 8.7 (*)    All other components within normal limits  OCCULT BLOOD X 1 CARD TO LAB, STOOL - Abnormal; Notable for the following:    Fecal Occult Bld POSITIVE (*)    All other components within normal limits  GASTROINTESTINAL PANEL BY PCR, STOOL (REPLACES STOOL CULTURE)  LIPASE, BLOOD  CBC  URINALYSIS, ROUTINE W REFLEX MICROSCOPIC  PREGNANCY, URINE  I-STAT BETA HCG BLOOD, ED ([facility], [facility], [facility] ONLY)    EKG  EKG Interpretation None       Radiology No results found.  Procedures Procedures (including critical care time)  Medications Ordered in ED Medications  ondansetron (ZOFRAN) injection 4 mg (not administered)  sodium chloride 0.9 % bolus 1,000 mL (not administered)  HYDROmorphone (DILAUDID) injection 1 mg (not administered)  iopamidol (ISOVUE-300) 61 % injection 100 mL (not administered)     Initial Impression / Assessment and Plan / ED Course  I have reviewed the triage vital signs and the nursing notes.  Pertinent labs & imaging results that were available during my care of the patient were reviewed by me and considered in my medical decision making (see chart for details).     Patient with acute onset of abdominal pain followed by nausea, vomiting and diarrhea. Diarrhea has now turned bloody in nature. Examination reveals diffuse  tenderness, predominantly upper abdomen. No focal left lower quadrant tenderness. Laboratory data is unrevealing. PCR analysis for stool pathogens pending. No concern for C. difficile. CT abdomen and pelvis will be performed to further evaluate. Sign out to oncoming ER physician  Final Clinical Impressions(s) / ED Diagnoses   Final diagnoses:  Generalized abdominal pain    New Prescriptions New Prescriptions   No medications on file     Gilda Creasehristopher J Joice Nazario, MD 03/03/16 [phone]

## 2017-04-23 ENCOUNTER — Encounter (HOSPITAL_COMMUNITY): Payer: Self-pay

## 2017-04-23 ENCOUNTER — Other Ambulatory Visit: Payer: Self-pay

## 2017-04-23 ENCOUNTER — Emergency Department (HOSPITAL_COMMUNITY)
Admission: EM | Admit: 2017-04-23 | Discharge: 2017-04-23 | Disposition: A | Discharge status: Home / Self Care | Payer: Managed Care, Other (non HMO) | Attending: Emergency Medicine | Admitting: Emergency Medicine

## 2017-04-23 DIAGNOSIS — L0501 Pilonidal cyst with abscess: Secondary | ICD-10-CM | POA: Insufficient documentation

## 2017-04-23 DIAGNOSIS — Z8614 Personal history of Methicillin resistant Staphylococcus aureus infection: Secondary | ICD-10-CM | POA: Insufficient documentation

## 2017-04-23 DIAGNOSIS — M3210 Systemic lupus erythematosus, organ or system involvement unspecified: Secondary | ICD-10-CM | POA: Insufficient documentation

## 2017-04-23 DIAGNOSIS — Z79899 Other long term (current) drug therapy: Secondary | ICD-10-CM | POA: Diagnosis not present

## 2017-04-23 DIAGNOSIS — L0231 Cutaneous abscess of buttock: Secondary | ICD-10-CM | POA: Diagnosis not present

## 2017-04-23 MED ORDER — LIDOCAINE HCL (PF) 1 % IJ SOLN
30.0000 mL | Freq: Once | INTRAMUSCULAR | Status: AC
  Administered 2017-04-23: 30 mL via INTRADERMAL

## 2017-04-23 MED ORDER — DOXYCYCLINE HYCLATE 100 MG PO CAPS: 100.0000 mg | ORAL_CAPSULE | Freq: Two times a day (BID) | ORAL | 0 refills | Status: AC

## 2017-04-23 MED ORDER — LIDOCAINE HCL (PF) 1 % IJ SOLN
INTRAMUSCULAR | Status: AC
  Administered 2017-04-23: 30 mL via INTRADERMAL
  Filled 2017-04-23: qty 30

## 2017-04-23 NOTE — ED Triage Notes (Signed)
Patient c/o right buttock abscess x 2 days. Patient states she had a temp 100.1 this AM at 0615.

## 2017-04-23 NOTE — ED Provider Notes (Signed)
[facility]-EMERGENCY DEPT Provider Note   CSN: [id number] Arrival date & time: 04/23/17  1519     History   Chief Complaint Chief Complaint  Patient presents with  . Abscess  . Fever    HPI Anita Keller is a 32 y.o. 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 One is at the gluteal cleft and the other on the right lateral buttock. Patient concerned due to hx of MRSA.  HPI  Past Medical History:  Diagnosis Date  . Embolism - blood clot   . Hypothyroidism   . Lupus (systemic lupus erythematosus) (HCC) 07/2009  . Proteinuria 10/2008    Patient Active Problem List   Diagnosis Date Noted  . Chronic daily headache 11/28/2015  . SLE (systemic lupus erythematosus) (HCC) 11/28/2015    Past Surgical History:  Procedure Laterality Date  . KNEE SURGERY    . THYROID LOBECTOMY  Left    Benign     OB History   None      Home Medications    Prior to Admission medications   Medication Sig Start Date End Date Taking? Authorizing Provider  doxycycline (VIBRAMYCIN) 100 MG capsule Take 1 capsule (100 mg total) by mouth 2 (two) times daily. 04/23/17   Janne Napoleon, NP  HYDROcodone-acetaminophen (NORCO/VICODIN) 5-325 MG tablet Take 2 tablets by mouth every 4 (four) hours as needed. 03/03/16   Arby Barrette, MD  hydroxychloroquine (PLAQUENIL) 200 MG tablet Take 200 mg by mouth 2 (two) times daily.    [provider]  Investigational - Study Medication Take 1-3 tablets by mouth 2 (two) times daily. Study name:Lupus Additional study details:Patient is taking a study medication for lupus.Takes 3 tablets in the morning and 1 tablet at night    [provider]  levonorgestrel (MIRENA) 20 MCG/24HR IUD 1 each by Intrauterine route once. September 2014    [provider]  levothyroxine (SYNTHROID, LEVOTHROID) 100 MCG tablet Take 100 mcg by mouth daily before  breakfast.    [provider]  mycophenolate (CELLCEPT) 500 MG tablet Take 1,000 mg by mouth 2 (two) times daily.     [provider]  ondansetron (ZOFRAN ODT) 4 MG disintegrating tablet Take 1 tablet (4 mg total) by mouth every 4 (four) hours as needed for nausea or vomiting. 03/03/16   Arby Barrette, MD    Family History Family History  Problem Relation Age of Onset  . Cancer Mother   . Hypertension Sister   . Asthma Brother     Social History Social History   Tobacco Use  . Smoking status: Never Smoker  . Smokeless tobacco: Never Used  Substance Use Topics  . Alcohol use: No  . Drug use: No     Allergies   Aspirin; Codeine; and Sulfa antibiotics   Review of Systems Review of Systems  Constitutional: Positive for chills. Fever: ?  HENT: Negative.   Musculoskeletal: Positive for arthralgias.  Skin: Positive for wound.  All other systems reviewed and are negative.    Physical Exam Updated Vital Signs BP 126/76 (BP Location: Right Arm)   Pulse (!) 102   Temp 98.2 F (36.8 C) (Oral)   Resp 18   Ht 5\' 4"  (1.626 m)   Wt 89.8 kg (198 lb)   LMP 04/04/2017   SpO2 99%   BMI 33.99 kg/m   Physical Exam  Constitutional: She appears well-developed  and well-nourished. No distress.  HENT:  Head: Normocephalic.  Eyes: EOM are normal.  Neck: Neck supple.  Cardiovascular: Tachycardia present.  Pulmonary/Chest: Effort normal.  Abdominal: Soft. There is no tenderness.  Musculoskeletal: Normal range of motion.       Back:  Neurological: She is alert.  Skin: Skin is warm and dry.  Psychiatric: She has a normal mood and affect. Her behavior is normal.  Nursing note and vitals reviewed.    ED Treatments / Results  Labs (all labs ordered are listed, but only abnormal results are displayed) Labs Reviewed - No data to display  EKG None  Radiology No results found.  Procedures .Marland Kitchen.Incision and Drainage Date/Time: 04/23/2017 5:00 PM Performed  by: Janne NapoleonNeese, Blakleigh Straw M, NP Authorized by: Janne NapoleonNeese, Jordyn Doane M, NP   Consent:    Consent obtained:  Verbal   Consent given by:  Patient   Risks discussed:  Incomplete drainage and pain   Alternatives discussed:  Alternative treatment Location:    Type:  Abscess   Location:  Anogenital   Anogenital location:  Pilonidal Pre-procedure details:    Skin preparation:  Betadine Anesthesia (see MAR for exact dosages):    Anesthesia method:  Local infiltration   Local anesthetic:  Lidocaine 1% w/o epi Procedure type:    Complexity:  Complex Procedure details:    Needle aspiration: no     Incision types:  Single straight   Incision depth:  Dermal   Scalpel blade:  11   Wound management:  Probed and deloculated and irrigated with saline   Drainage:  Purulent and bloody   Drainage amount:  Moderate   Packing materials:  1/4 in iodoform gauze Post-procedure details:    Patient tolerance of procedure:  Tolerated well, no immediate complications Comments:     Patient reports being up to date on tetanus .Marland Kitchen.Incision and Drainage Date/Time: 04/23/2017 7:40 PM Performed by: Janne NapoleonNeese, Margaretann Abate M, NP Authorized by: Janne NapoleonNeese, Lilienne Weins M, NP   Consent:    Consent obtained:  Verbal   Consent given by:  Patient   Risks discussed:  Bleeding and pain   Alternatives discussed:  Alternative treatment Location:    Type:  Abscess   Location:  Anogenital   Anogenital location: right buttock. Pre-procedure details:    Skin preparation:  Betadine Anesthesia (see MAR for exact dosages):    Anesthesia method:  Local infiltration   Local anesthetic:  Lidocaine 1% w/o epi Procedure type:    Complexity:  Complex Procedure details:    Needle aspiration: no     Incision types:  Single straight   Incision depth:  Dermal   Scalpel blade:  11   Wound management:  Probed and deloculated and irrigated with saline   Drainage:  Bloody and purulent   Drainage amount:  Moderate   Packing materials:  1/4 in gauze Post-procedure  details:    Patient tolerance of procedure:  Tolerated well, no immediate complications   (including critical care time)  Medications Ordered in ED Medications  lidocaine (PF) (XYLOCAINE) 1 % injection 30 mL (30 mLs Intradermal Given by Other 04/23/17 1740)     Initial Impression / Assessment and Plan / ED Course  I have reviewed the triage vital signs and the nursing notes. 32 y.o. female with hx of MRSA and c/o abscess to buttock stable for d/c without fever and does not appear toxic. Will treat with antibiotics and patient will f/u in 2 days for wound check and packing removal. She will return sooner  for any problems.   Final Clinical Impressions(s) / ED Diagnoses   Final diagnoses:  Pilonidal abscess  Abscess of buttock, right    ED Discharge Orders        Ordered    doxycycline (VIBRAMYCIN) 100 MG capsule  2 times daily     04/23/17 1719       Damian Leavell Ruhenstroth, NP 04/23/17 1942    Tegeler, Canary Brim, MD 04/23/17 [phone]

## 2017-04-23 NOTE — Discharge Instructions (Addendum)
Take the antibiotic and take tylenol or ibuprofen as needed for pain. Follow up in 2 days for wound check and packing removal. Return sooner for any problems.

## 2017-04-25 ENCOUNTER — Emergency Department (HOSPITAL_COMMUNITY)
Admission: EM | Admit: 2017-04-25 | Discharge: 2017-04-25 | Disposition: A | Discharge status: Home / Self Care | Payer: Managed Care, Other (non HMO) | Attending: Emergency Medicine | Admitting: Emergency Medicine

## 2017-04-25 ENCOUNTER — Encounter (HOSPITAL_COMMUNITY): Payer: Self-pay | Admitting: Emergency Medicine

## 2017-04-25 DIAGNOSIS — S31829D Unspecified open wound of left buttock, subsequent encounter: Secondary | ICD-10-CM | POA: Diagnosis not present

## 2017-04-25 DIAGNOSIS — Z79899 Other long term (current) drug therapy: Secondary | ICD-10-CM | POA: Insufficient documentation

## 2017-04-25 DIAGNOSIS — E039 Hypothyroidism, unspecified: Secondary | ICD-10-CM | POA: Diagnosis not present

## 2017-04-25 DIAGNOSIS — X58XXXA Exposure to other specified factors, initial encounter: Secondary | ICD-10-CM | POA: Insufficient documentation

## 2017-04-25 DIAGNOSIS — Z5189 Encounter for other specified aftercare: Secondary | ICD-10-CM

## 2017-04-25 DIAGNOSIS — R222 Localized swelling, mass and lump, trunk: Secondary | ICD-10-CM | POA: Diagnosis present

## 2017-04-25 MED ORDER — BACITRACIN ZINC 500 UNIT/GM EX OINT
TOPICAL_OINTMENT | Freq: Once | CUTANEOUS | Status: AC
  Administered 2017-04-25: 20:00:00 via TOPICAL
  Filled 2017-04-25: qty 0.9

## 2017-04-25 NOTE — ED Provider Notes (Signed)
[facility]-EMERGENCY DEPT Provider Note   CSN: [id number] Arrival date & time: 04/25/17  1735     History   Chief Complaint Chief Complaint  Patient presents with  . Wound Check    HPI Anita Keller is a 32 y.o. female.  The history is provided by the patient and medical records. No language interpreter was used.  Wound Check    Anita Keller is a 32 y.o. female who resents to the emergency department today for recheck of abscess to the buttocks.  Patient was seen on 3/22 and informed to follow-up today for recheck and packing removal.  Patient feels as if wound is healing well.  She has been taking antibiotics as directed without any missed doses.  Denies any fever or chills.  Still having some drainage from abscess.  Past Medical History:  Diagnosis Date  . Embolism - blood clot   . Hypothyroidism   . Lupus (systemic lupus erythematosus) (HCC) 07/2009  . Proteinuria 10/2008    Patient Active Problem List   Diagnosis Date Noted  . Chronic daily headache 11/28/2015  . SLE (systemic lupus erythematosus) (HCC) 11/28/2015    Past Surgical History:  Procedure Laterality Date  . KNEE SURGERY    . THYROID LOBECTOMY  Left    Benign     OB History   None      Home Medications    Prior to Admission medications   Medication Sig Start Date End Date Taking? Authorizing Provider  doxycycline (VIBRAMYCIN) 100 MG capsule Take 1 capsule (100 mg total) by mouth 2 (two) times daily. 04/23/17   Janne Napoleon, NP  HYDROcodone-acetaminophen (NORCO/VICODIN) 5-325 MG tablet Take 2 tablets by mouth every 4 (four) hours as needed. 03/03/16   Arby Barrette, MD  hydroxychloroquine (PLAQUENIL) 200 MG tablet Take 200 mg by mouth 2 (two) times daily.    [provider]  Investigational - Study Medication Take 1-3 tablets by mouth 2 (two) times daily. Study name:Lupus Additional study details:Patient is taking a study medication for lupus.Takes 3 tablets in  the morning and 1 tablet at night    [provider]  levonorgestrel (MIRENA) 20 MCG/24HR IUD 1 each by Intrauterine route once. September 2014    [provider]  levothyroxine (SYNTHROID, LEVOTHROID) 100 MCG tablet Take 100 mcg by mouth daily before breakfast.    [provider]  mycophenolate (CELLCEPT) 500 MG tablet Take 1,000 mg by mouth 2 (two) times daily.     [provider]  ondansetron (ZOFRAN ODT) 4 MG disintegrating tablet Take 1 tablet (4 mg total) by mouth every 4 (four) hours as needed for nausea or vomiting. 03/03/16   Arby Barrette, MD    Family History Family History  Problem Relation Age of Onset  . Cancer Mother   . Hypertension Sister   . Asthma Brother     Social History Social History   Tobacco Use  . Smoking status: Never Smoker  . Smokeless tobacco: Never Used  Substance Use Topics  . Alcohol use: No  . Drug use: No     Allergies   Aspirin; Codeine; and Sulfa antibiotics   Review of Systems Review of Systems  Constitutional: Negative for chills and fever.  Skin: Positive for wound.     Physical Exam Updated Vital Signs BP 116/82   Pulse 94   Temp 98.8 F (37.1 C)   Resp 16   LMP 04/04/2017   SpO2 100%   Physical Exam  Constitutional: She appears well-developed and well-nourished. No distress.  HENT:  Head: Normocephalic and atraumatic.  Neck: Neck supple.  Cardiovascular: Normal rate, regular rhythm and normal heart sounds.  No murmur heard. Pulmonary/Chest: Effort normal and breath sounds normal. No respiratory distress. She has no wheezes. She has no rales.  Musculoskeletal: Normal range of motion.  Abscess to left gluteal cleft with packing in place.  No surrounding erythema or warmth to the touch.  Abscess with surrounding induration to right lateral buttocks with packing in place.  Tender to touch.  Neurological: She is alert.  Skin: Skin is warm and dry.  Nursing note and vitals  reviewed.    ED Treatments / Results  Labs (all labs ordered are listed, but only abnormal results are displayed) Labs Reviewed - No data to display  EKG None  Radiology No results found.  Procedures Procedures (including critical care time)  Medications Ordered in ED Medications  bacitracin ointment (has no administration in time range)     Initial Impression / Assessment and Plan / ED Course  I have reviewed the triage vital signs and the nursing notes.  Pertinent labs & imaging results that were available during my care of the patient were reviewed by me and considered in my medical decision making (see chart for details).    Anita Keller is a 32 y.o. female who presents to ED for wound check of 2 buttock abscesses.  Gluteal cleft abscess is healing very well.  Packing was removed.  Lateral abscess is also healing.  Packing was removed and there was some purulent drainage with this.  I irrigated the abscess well and expressed more purulent discharge.  Continue antibiotics.  We discussed home care instructions and return precautions.  All questions were answered.   Final Clinical Impressions(s) / ED Diagnoses   Final diagnoses:  Visit for wound check    ED Discharge Orders    None       Vega Withrow, Chase PicketJaime Pilcher, PA-C 04/25/17 1934    Melene PlanFloyd, Dan, DO 04/25/17 2306

## 2017-04-25 NOTE — ED Triage Notes (Signed)
Patient seen x2 days ago and told to return today to have packing removed from abscess on buttock.

## 2017-04-25 NOTE — Discharge Instructions (Signed)
Continue taking antibiotics until finished.  Continue home wound care. Wash the sites daily. Keep clean and dry.  Return to ER for fevers, new or worsening symptoms, any additional concerns.
